# Patient Record
Sex: Male | Born: 1960
Health system: Southern US, Community
[De-identification: ages and names within clinical notes are randomized; demographics above are authoritative.]

## PROBLEM LIST (undated history)

## (undated) DIAGNOSIS — I10 Essential (primary) hypertension: Secondary | ICD-10-CM

## (undated) DIAGNOSIS — K649 Unspecified hemorrhoids: Secondary | ICD-10-CM

## (undated) DIAGNOSIS — R12 Heartburn: Secondary | ICD-10-CM

## (undated) DIAGNOSIS — M1990 Unspecified osteoarthritis, unspecified site: Secondary | ICD-10-CM

## (undated) HISTORY — PX: HERNIA REPAIR: SHX51

---

## 1999-07-03 ENCOUNTER — Encounter: Payer: Self-pay | Admitting: Internal Medicine

## 1999-07-03 ENCOUNTER — Encounter: Admission: RE | Admit: 1999-07-03 | Discharge: 1999-07-03 | Payer: Self-pay | Admitting: Internal Medicine

## 2001-09-15 ENCOUNTER — Emergency Department (HOSPITAL_COMMUNITY): Admission: EM | Admit: 2001-09-15 | Discharge: 2001-09-15 | Payer: Self-pay | Admitting: Emergency Medicine

## 2005-06-23 ENCOUNTER — Emergency Department (HOSPITAL_COMMUNITY): Admission: EM | Admit: 2005-06-23 | Discharge: 2005-06-23 | Payer: Self-pay | Admitting: Family Medicine

## 2012-12-27 ENCOUNTER — Encounter (HOSPITAL_COMMUNITY): Payer: Self-pay | Admitting: Pharmacy Technician

## 2012-12-29 ENCOUNTER — Other Ambulatory Visit (HOSPITAL_COMMUNITY): Payer: Self-pay | Admitting: Orthopedic Surgery

## 2012-12-29 NOTE — Patient Instructions (Addendum)
Mitchell Johnston  12/29/2012   Your procedure is scheduled on: 01/09/13  TUESDAY   Report to [facility] at     12:50 PM Call this number if you have problems the morning of surgery: [phone]       Remember:   Do not eat food  After Midnight. Monday NIGHT--- MAY HAVE CLEAR LIQUIDS Tuesday MORNING UNTIL 0945 AM,  THEN NOTHING BY MOUTH   Take these medicines the morning of surgery with A SIP OF WATER: NONE   .  Contacts, dentures or partial plates can not be worn to surgery  Leave suitcase in the car. After surgery it may be brought to your room.  For patients admitted to the hospital, checkout time is 11:00 AM day of  discharge.             SPECIAL INSTRUCTIONS- SEE [place] PREPARING FOR SURGERY INSTRUCTION SHEET-     DO NOT WEAR JEWELRY, LOTIONS, POWDERS, OR PERFUMES.  WOMEN-- DO NOT SHAVE LEGS OR UNDERARMS FOR 12 HOURS BEFORE SHOWERS. MEN MAY SHAVE FACE.  Patients discharged the day of surgery will not be allowed to drive home. IF going home the day of surgery, you must have a driver and someone to stay with you for the first 24 hours  Name and phone number of your driver:     admission                                                                   Please read over the following fact sheets that you were given: MRSA Information, Incentive Spirometry Sheet, Blood Transfusion Sheet  Information                                                                                 I understand I will have my Type and screen drawn morning of surgery  [name]  PST [phone]  [phone]                 FAILURE TO FOLLOW THESE INSTRUCTIONS MAY RESULT IN  CANCELLATION   OF YOUR SURGERY                                                  Patient Signature _____________________________

## 2013-01-01 ENCOUNTER — Ambulatory Visit (HOSPITAL_COMMUNITY)
Admission: RE | Admit: 2013-01-01 | Discharge: 2013-01-01 | Disposition: A | Discharge status: Home / Self Care | Payer: 59 | Source: Ambulatory Visit | Attending: Orthopedic Surgery | Admitting: Orthopedic Surgery

## 2013-01-01 ENCOUNTER — Encounter (HOSPITAL_COMMUNITY)
Admission: RE | Admit: 2013-01-01 | Discharge: 2013-01-01 | Disposition: A | Discharge status: Home / Self Care | Payer: 59 | Source: Ambulatory Visit | Attending: Orthopedic Surgery | Admitting: Orthopedic Surgery

## 2013-01-01 ENCOUNTER — Encounter (HOSPITAL_COMMUNITY): Payer: Self-pay

## 2013-01-01 DIAGNOSIS — Z01812 Encounter for preprocedural laboratory examination: Secondary | ICD-10-CM | POA: Insufficient documentation

## 2013-01-01 DIAGNOSIS — Z01810 Encounter for preprocedural cardiovascular examination: Secondary | ICD-10-CM | POA: Insufficient documentation

## 2013-01-01 DIAGNOSIS — Z01818 Encounter for other preprocedural examination: Secondary | ICD-10-CM | POA: Insufficient documentation

## 2013-01-01 HISTORY — DX: Unspecified osteoarthritis, unspecified site: M19.90

## 2013-01-01 HISTORY — DX: Heartburn: R12

## 2013-01-01 HISTORY — DX: Unspecified hemorrhoids: K64.9

## 2013-01-01 HISTORY — DX: Essential (primary) hypertension: I10

## 2013-01-01 LAB — BASIC METABOLIC PANEL
Chloride: 104 mEq/L (ref 96–112)
GFR calc Af Amer: >90 mL/min (ref >90)
Potassium: 4.1 mEq/L (ref 3.5–5.1)

## 2013-01-01 LAB — PROTIME-INR: INR: 1.02 (ref 0.00–1.49)

## 2013-01-01 LAB — CBC
HCT: 38.1 % — ABNORMAL LOW (ref 39.0–52.0)
Hemoglobin: 14 g/dL (ref 13.0–17.0)
WBC: 7.9 10*3/uL (ref 4.0–10.5)

## 2013-01-01 LAB — URINALYSIS, ROUTINE W REFLEX MICROSCOPIC
Leukocytes, UA: NEGATIVE
Nitrite: NEGATIVE
Specific Gravity, Urine: 1.025 (ref 1.005–1.030)
pH: 6 (ref 5.0–8.0)

## 2013-01-01 LAB — APTT: aPTT: 32 seconds (ref 24–37)

## 2013-01-01 LAB — SURGICAL PCR SCREEN: Staphylococcus aureus: NEGATIVE

## 2013-01-03 NOTE — H&P (Signed)
TOTAL HIP ADMISSION H&P  Patient is admitted for right total hip arthroplasty, anterior approach.  Subjective:  Chief Complaint:   Right hip OA / pain  HPI: Mitchell Johnston, 52 y.o. male, has a history of pain and functional disability in the right hip(s) due to arthritis and patient has failed non-surgical conservative treatments for greater than 12 weeks to include NSAID's and/or analgesics and activity modification.  Onset of symptoms was gradual starting 5+ years ago with gradually worsening course since that time.The patient noted no past surgery on the right hip(s).  Patient currently rates pain in the right hip at 7 out of 10 with activity. Patient has worsening of pain with activity and weight bearing, trendelenberg gait, pain that interfers with activities of daily living, pain with passive range of motion and crepitus. Patient has evidence of periarticular osteophytes and joint space narrowing by imaging studies. This condition presents safety issues increasing the risk of falls.  There is no current active infection.  Risks, benefits and expectations were discussed with the patient. Patient understand the risks, benefits and expectations and wishes to proceed with surgery.   D/C Plans:   Home with HHPT  Post-op Meds:    No Rx given  Tranexamic Acid:   To be given  Decadron:    To be given  FYI:    ASA post-op   Past Medical History  Diagnosis Date  . Hypertension   . Heartburn   . Arthritis   . Hemorrhoids     Past Surgical History  Procedure Laterality Date  . Hernia repair      inguinal    No prescriptions prior to admission   No Known Allergies   History  Substance Use Topics  . Smoking status: Never Smoker   . Smokeless tobacco: Never Used  . Alcohol Use: No    No family history on file.   Review of Systems  Constitutional: Negative.   HENT: Negative.   Eyes: Negative.   Respiratory: Negative.   Cardiovascular: Negative.   Gastrointestinal: Positive  for heartburn.  Genitourinary: Negative.   Musculoskeletal: Positive for joint pain and myalgias (morning stiffness).  Skin: Negative.   Neurological: Negative.   Endo/Heme/Allergies: Negative.   Psychiatric/Behavioral: Negative.     Objective:  Physical Exam  Constitutional: He appears well-developed and well-nourished.  HENT:  Head: Normocephalic and atraumatic.  Mouth/Throat: Oropharynx is clear and moist.  Eyes: Pupils are equal, round, and reactive to light.  Neck: Neck supple. No JVD present. No tracheal deviation present. No thyromegaly present.  Cardiovascular: Normal rate, regular rhythm, normal heart sounds and intact distal pulses.   Respiratory: Effort normal and breath sounds normal. No stridor. No respiratory distress. He has no wheezes.  GI: Soft. There is no tenderness. There is no guarding.  Musculoskeletal:       Right hip: He exhibits decreased range of motion, decreased strength, tenderness, bony tenderness and crepitus. He exhibits no swelling, no deformity and no laceration.  Lymphadenopathy:    He has no cervical adenopathy.  Neurological: He is alert.  Skin: Skin is warm and dry.  Psychiatric: He has a normal mood and affect.     Imaging Review Plain radiographs demonstrate severe degenerative joint disease of the right hip(s). The bone quality appears to be good for age and reported activity level.  Assessment/Plan:  End stage arthritis, right hip(s)  The patient history, physical examination, clinical judgement of the provider and imaging studies are consistent with end stage degenerative  joint disease of the right hip(s) and total hip arthroplasty is deemed medically necessary. The treatment options including medical management, injection therapy, arthroscopy and arthroplasty were discussed at length. The risks and benefits of total hip arthroplasty were presented and reviewed. The risks due to aseptic loosening, infection, stiffness,  dislocation/subluxation,  thromboembolic complications and other imponderables were discussed.  The patient acknowledged the explanation, agreed to proceed with the plan and consent was signed. Patient is being admitted for inpatient treatment for surgery, pain control, PT, OT, prophylactic antibiotics, VTE prophylaxis, progressive ambulation and ADL's and discharge planning.The patient is planning to be discharged home with home health services.    Anastasio Auerbach Neri Vieyra   PAC  01/03/2013, 3:20 PM

## 2013-01-09 ENCOUNTER — Inpatient Hospital Stay (HOSPITAL_COMMUNITY): Payer: 59

## 2013-01-09 ENCOUNTER — Encounter (HOSPITAL_COMMUNITY): Payer: 59 | Admitting: Anesthesiology

## 2013-01-09 ENCOUNTER — Encounter (HOSPITAL_COMMUNITY): Payer: Self-pay | Admitting: *Deleted

## 2013-01-09 ENCOUNTER — Inpatient Hospital Stay (HOSPITAL_COMMUNITY)
Admission: RE | Admit: 2013-01-09 | Discharge: 2013-01-11 | DRG: 470 | Disposition: A | Discharge status: Home Health | Payer: 59 | Source: Ambulatory Visit | Attending: Orthopedic Surgery | Admitting: Orthopedic Surgery

## 2013-01-09 ENCOUNTER — Inpatient Hospital Stay (HOSPITAL_COMMUNITY): Payer: 59 | Admitting: Anesthesiology

## 2013-01-09 ENCOUNTER — Encounter (HOSPITAL_COMMUNITY)
Admission: RE | Disposition: A | Discharge status: Home Health | Payer: Self-pay | Source: Ambulatory Visit | Attending: Orthopedic Surgery

## 2013-01-09 DIAGNOSIS — D500 Iron deficiency anemia secondary to blood loss (chronic): Secondary | ICD-10-CM | POA: Diagnosis not present

## 2013-01-09 DIAGNOSIS — M1610 Unilateral primary osteoarthritis, unspecified hip: Principal | ICD-10-CM | POA: Diagnosis present

## 2013-01-09 DIAGNOSIS — M169 Osteoarthritis of hip, unspecified: Principal | ICD-10-CM | POA: Diagnosis present

## 2013-01-09 DIAGNOSIS — Z96649 Presence of unspecified artificial hip joint: Secondary | ICD-10-CM

## 2013-01-09 DIAGNOSIS — D62 Acute posthemorrhagic anemia: Secondary | ICD-10-CM | POA: Diagnosis not present

## 2013-01-09 DIAGNOSIS — I10 Essential (primary) hypertension: Secondary | ICD-10-CM | POA: Diagnosis present

## 2013-01-09 HISTORY — PX: TOTAL HIP ARTHROPLASTY: SHX124

## 2013-01-09 LAB — TYPE AND SCREEN: ABO/RH(D): O POS

## 2013-01-09 SURGERY — ARTHROPLASTY, HIP, TOTAL, ANTERIOR APPROACH
Anesthesia: Spinal | Site: Hip | Laterality: Right | Wound class: Clean

## 2013-01-09 MED ORDER — BISACODYL 10 MG RE SUPP: 10.0000 mg | Freq: Every day | RECTAL | Status: DC | PRN

## 2013-01-09 MED ORDER — METOCLOPRAMIDE HCL 10 MG PO TABS: 5.0000 mg | ORAL_TABLET | Freq: Three times a day (TID) | ORAL | Status: DC | PRN

## 2013-01-09 MED ORDER — PROPOFOL INFUSION 10 MG/ML OPTIME
INTRAVENOUS | Status: DC | PRN
  Administered 2013-01-09: 100 ug/kg/min via INTRAVENOUS

## 2013-01-09 MED ORDER — CELECOXIB 200 MG PO CAPS
200.0000 mg | ORAL_CAPSULE | Freq: Two times a day (BID) | ORAL | Status: DC
  Administered 2013-01-09 – 2013-01-11 (×4): 200 mg via ORAL
  Filled 2013-01-09 (×5): qty 1

## 2013-01-09 MED ORDER — LACTATED RINGERS IV SOLN: INTRAVENOUS | Status: DC

## 2013-01-09 MED ORDER — DEXAMETHASONE SODIUM PHOSPHATE 10 MG/ML IJ SOLN
10.0000 mg | Freq: Once | INTRAMUSCULAR | Status: AC
  Administered 2013-01-10: 10 mg via INTRAVENOUS
  Filled 2013-01-09: qty 1

## 2013-01-09 MED ORDER — ZOLPIDEM TARTRATE 5 MG PO TABS: 5.0000 mg | ORAL_TABLET | Freq: Every evening | ORAL | Status: DC | PRN

## 2013-01-09 MED ORDER — HYDROCODONE-ACETAMINOPHEN 7.5-325 MG PO TABS
1.0000 | ORAL_TABLET | ORAL | Status: DC
  Administered 2013-01-09 – 2013-01-10 (×3): 2 via ORAL
  Administered 2013-01-10 (×2): 1 via ORAL
  Administered 2013-01-10 – 2013-01-11 (×2): 2 via ORAL
  Filled 2013-01-09: qty 1
  Filled 2013-01-09 (×7): qty 2

## 2013-01-09 MED ORDER — SODIUM CHLORIDE 0.9 % IV SOLN
100.0000 mL/h | INTRAVENOUS | Status: DC
  Administered 2013-01-09 – 2013-01-10 (×2): 100 mL/h via INTRAVENOUS
  Filled 2013-01-09 (×10): qty 1000

## 2013-01-09 MED ORDER — TRANEXAMIC ACID 100 MG/ML IV SOLN
1000.0000 mg | Freq: Once | INTRAVENOUS | Status: AC
  Administered 2013-01-09: 1000 mg via INTRAVENOUS
  Filled 2013-01-09: qty 10

## 2013-01-09 MED ORDER — ONDANSETRON HCL 4 MG/2ML IJ SOLN
4.0000 mg | Freq: Four times a day (QID) | INTRAMUSCULAR | Status: DC | PRN
  Administered 2013-01-10: 4 mg via INTRAVENOUS
  Filled 2013-01-09: qty 2

## 2013-01-09 MED ORDER — TRIAMTERENE-HCTZ 37.5-25 MG PO TABS
1.0000 | ORAL_TABLET | Freq: Every morning | ORAL | Status: DC
  Administered 2013-01-10 – 2013-01-11 (×2): 1 via ORAL
  Filled 2013-01-09 (×2): qty 1

## 2013-01-09 MED ORDER — MIDAZOLAM HCL 5 MG/5ML IJ SOLN
INTRAMUSCULAR | Status: DC | PRN
  Administered 2013-01-09: 2 mg via INTRAVENOUS

## 2013-01-09 MED ORDER — FLEET ENEMA 7-19 GM/118ML RE ENEM: 1.0000 | ENEMA | Freq: Once | RECTAL | Status: AC | PRN

## 2013-01-09 MED ORDER — PHENOL 1.4 % MT LIQD: 1.0000 | OROMUCOSAL | Status: DC | PRN

## 2013-01-09 MED ORDER — DIPHENHYDRAMINE HCL 25 MG PO CAPS: 25.0000 mg | ORAL_CAPSULE | Freq: Four times a day (QID) | ORAL | Status: DC | PRN

## 2013-01-09 MED ORDER — METOCLOPRAMIDE HCL 5 MG/ML IJ SOLN
5.0000 mg | Freq: Three times a day (TID) | INTRAMUSCULAR | Status: DC | PRN
  Administered 2013-01-10: 10 mg via INTRAVENOUS
  Filled 2013-01-09: qty 2

## 2013-01-09 MED ORDER — METHOCARBAMOL 500 MG PO TABS
500.0000 mg | ORAL_TABLET | Freq: Four times a day (QID) | ORAL | Status: DC | PRN
  Administered 2013-01-10: 500 mg via ORAL
  Filled 2013-01-09 (×2): qty 1

## 2013-01-09 MED ORDER — FERROUS SULFATE 325 (65 FE) MG PO TABS
325.0000 mg | ORAL_TABLET | Freq: Three times a day (TID) | ORAL | Status: DC
  Administered 2013-01-10 – 2013-01-11 (×5): 325 mg via ORAL
  Filled 2013-01-09 (×7): qty 1

## 2013-01-09 MED ORDER — HYDROMORPHONE HCL PF 1 MG/ML IJ SOLN: 0.2500 mg | INTRAMUSCULAR | Status: DC | PRN

## 2013-01-09 MED ORDER — MENTHOL 3 MG MT LOZG: 1.0000 | LOZENGE | OROMUCOSAL | Status: DC | PRN

## 2013-01-09 MED ORDER — CEFAZOLIN SODIUM-DEXTROSE 2-3 GM-% IV SOLR
INTRAVENOUS | Status: AC
  Filled 2013-01-09: qty 50

## 2013-01-09 MED ORDER — HYDROMORPHONE HCL PF 1 MG/ML IJ SOLN
0.5000 mg | INTRAMUSCULAR | Status: DC | PRN
  Administered 2013-01-09: 1 mg via INTRAVENOUS
  Filled 2013-01-09: qty 1

## 2013-01-09 MED ORDER — ALUM & MAG HYDROXIDE-SIMETH 200-200-20 MG/5ML PO SUSP: 30.0000 mL | ORAL | Status: DC | PRN

## 2013-01-09 MED ORDER — ASPIRIN EC 325 MG PO TBEC
325.0000 mg | DELAYED_RELEASE_TABLET | Freq: Two times a day (BID) | ORAL | Status: DC
  Administered 2013-01-10 – 2013-01-11 (×3): 325 mg via ORAL
  Filled 2013-01-09 (×5): qty 1

## 2013-01-09 MED ORDER — LACTATED RINGERS IV SOLN
INTRAVENOUS | Status: DC | PRN
  Administered 2013-01-09 (×2): via INTRAVENOUS

## 2013-01-09 MED ORDER — POLYETHYLENE GLYCOL 3350 17 G PO PACK
17.0000 g | PACK | Freq: Two times a day (BID) | ORAL | Status: DC
  Administered 2013-01-10 – 2013-01-11 (×2): 17 g via ORAL

## 2013-01-09 MED ORDER — BUPIVACAINE HCL (PF) 0.5 % IJ SOLN
INTRAMUSCULAR | Status: AC
  Filled 2013-01-09: qty 30

## 2013-01-09 MED ORDER — CEFAZOLIN SODIUM-DEXTROSE 2-3 GM-% IV SOLR
2.0000 g | INTRAVENOUS | Status: AC
  Administered 2013-01-09: 2 g via INTRAVENOUS

## 2013-01-09 MED ORDER — CEFAZOLIN SODIUM-DEXTROSE 2-3 GM-% IV SOLR
2.0000 g | Freq: Four times a day (QID) | INTRAVENOUS | Status: AC
  Administered 2013-01-09 – 2013-01-10 (×2): 2 g via INTRAVENOUS
  Filled 2013-01-09 (×2): qty 50

## 2013-01-09 MED ORDER — DEXTROSE 5 % IV SOLN
500.0000 mg | Freq: Four times a day (QID) | INTRAVENOUS | Status: DC | PRN
  Filled 2013-01-09: qty 5

## 2013-01-09 MED ORDER — ONDANSETRON HCL 4 MG PO TABS: 4.0000 mg | ORAL_TABLET | Freq: Four times a day (QID) | ORAL | Status: DC | PRN

## 2013-01-09 MED ORDER — DOCUSATE SODIUM 100 MG PO CAPS
100.0000 mg | ORAL_CAPSULE | Freq: Two times a day (BID) | ORAL | Status: DC
  Administered 2013-01-09 – 2013-01-11 (×3): 100 mg via ORAL

## 2013-01-09 MED ORDER — DEXAMETHASONE SODIUM PHOSPHATE 10 MG/ML IJ SOLN: 10.0000 mg | Freq: Once | INTRAMUSCULAR | Status: DC

## 2013-01-09 MED ORDER — FENTANYL CITRATE 0.05 MG/ML IJ SOLN
INTRAMUSCULAR | Status: DC | PRN
  Administered 2013-01-09: 50 ug via INTRAVENOUS

## 2013-01-09 SURGICAL SUPPLY — 40 items
ADH SKN CLS APL DERMABOND .7 (GAUZE/BANDAGES/DRESSINGS) ×1
BAG SPEC THK2 15X12 ZIP CLS (MISCELLANEOUS) ×2
BAG ZIPLOCK 12X15 (MISCELLANEOUS) ×4 IMPLANT
BLADE SAW SGTL 18X1.27X75 (BLADE) ×2 IMPLANT
CAPT HIP PF COP ×1 IMPLANT
CLOTH BEACON ORANGE TIMEOUT ST (SAFETY) ×2 IMPLANT
DERMABOND ADVANCED (GAUZE/BANDAGES/DRESSINGS) ×1
DERMABOND ADVANCED .7 DNX12 (GAUZE/BANDAGES/DRESSINGS) ×1 IMPLANT
DRAPE C-ARM 42X120 X-RAY (DRAPES) ×2 IMPLANT
DRAPE STERI IOBAN 125X83 (DRAPES) ×2 IMPLANT
DRAPE U-SHAPE 47X51 STRL (DRAPES) ×6 IMPLANT
DRSG AQUACEL AG ADV 3.5X10 (GAUZE/BANDAGES/DRESSINGS) ×2 IMPLANT
DRSG TEGADERM 4X4.75 (GAUZE/BANDAGES/DRESSINGS) IMPLANT
DURAPREP 26ML APPLICATOR (WOUND CARE) ×2 IMPLANT
ELECT BLADE TIP CTD 4 INCH (ELECTRODE) ×2 IMPLANT
ELECT REM PT RETURN 9FT ADLT (ELECTROSURGICAL) ×2
ELECTRODE REM PT RTRN 9FT ADLT (ELECTROSURGICAL) ×1 IMPLANT
EVACUATOR 1/8 PVC DRAIN (DRAIN) IMPLANT
FACESHIELD LNG OPTICON STERILE (SAFETY) ×8 IMPLANT
GAUZE SPONGE 2X2 8PLY STRL LF (GAUZE/BANDAGES/DRESSINGS) ×1 IMPLANT
GLOVE BIOGEL PI IND STRL 7.5 (GLOVE) ×1 IMPLANT
GLOVE BIOGEL PI IND STRL 8 (GLOVE) ×1 IMPLANT
GLOVE BIOGEL PI INDICATOR 7.5 (GLOVE) ×1
GLOVE BIOGEL PI INDICATOR 8 (GLOVE) ×1
GLOVE ECLIPSE 8.0 STRL XLNG CF (GLOVE) ×2 IMPLANT
GLOVE ORTHO TXT STRL SZ7.5 (GLOVE) ×4 IMPLANT
GOWN BRE IMP PREV XXLGXLNG (GOWN DISPOSABLE) ×2 IMPLANT
GOWN PREVENTION PLUS LG XLONG (DISPOSABLE) ×2 IMPLANT
KIT BASIN OR (CUSTOM PROCEDURE TRAY) ×2 IMPLANT
PACK TOTAL JOINT (CUSTOM PROCEDURE TRAY) ×2 IMPLANT
PADDING CAST COTTON 6X4 STRL (CAST SUPPLIES) ×2 IMPLANT
SPONGE GAUZE 2X2 STER 10/PKG (GAUZE/BANDAGES/DRESSINGS) ×1
SUCTION FRAZIER 12FR DISP (SUCTIONS) ×2 IMPLANT
SUT MNCRL AB 4-0 PS2 18 (SUTURE) ×2 IMPLANT
SUT VIC AB 1 CT1 36 (SUTURE) ×8 IMPLANT
SUT VIC AB 2-0 CT1 27 (SUTURE) ×4
SUT VIC AB 2-0 CT1 TAPERPNT 27 (SUTURE) ×2 IMPLANT
SUT VLOC 180 0 24IN GS25 (SUTURE) ×2 IMPLANT
TOWEL OR 17X26 10 PK STRL BLUE (TOWEL DISPOSABLE) ×4 IMPLANT
TRAY FOLEY CATH 14FRSI W/METER (CATHETERS) ×2 IMPLANT

## 2013-01-09 NOTE — Anesthesia Postprocedure Evaluation (Signed)
  Anesthesia Post-op Note  Patient: Mitchell Johnston  Procedure(s) Performed: Procedure(s) (LRB): RIGHT TOTAL HIP ARTHROPLASTY ANTERIOR APPROACH (Right)  Patient Location: PACU  Anesthesia Type: Spinal  Level of Consciousness: awake and alert   Airway and Oxygen Therapy: Patient Spontanous Breathing  Post-op Pain: mild  Post-op Assessment: Post-op Vital signs reviewed, Patient's Cardiovascular Status Stable, Respiratory Function Stable, Patent Airway and No signs of Nausea or vomiting  Last Vitals:  Filed Vitals:   01/09/13 1915  BP:   Pulse:   Temp: 36.6 C  Resp:     Post-op Vital Signs: stable   Complications: No apparent anesthesia complications

## 2013-01-09 NOTE — Interval H&P Note (Signed)
History and Physical Interval Note:  01/09/2013 4:08 PM  Mitchell Johnston  has presented today for surgery, with the diagnosis of osteoarthritis of the Right Knee  The various methods of treatment have been discussed with the patient and family. After consideration of risks, benefits and other options for treatment, the patient has consented to  Procedure(s): RIGHT TOTAL HIP ARTHROPLASTY ANTERIOR APPROACH (Right) as a surgical intervention .  The patient's history has been reviewed, patient examined, no change in status, stable for surgery.  I have reviewed the patient's chart and labs.  Questions were answered to the patient's satisfaction.     Shelda Pal

## 2013-01-09 NOTE — Anesthesia Preprocedure Evaluation (Addendum)
Anesthesia Evaluation  Patient identified by MRN, date of birth, ID band Patient awake    Reviewed: Allergy & Precautions, H&P , NPO status , Patient's Chart, lab work & pertinent test results  Airway Mallampati: II TM Distance: >3 FB Neck ROM: full    Dental no notable dental hx. (+) Teeth Intact and Dental Advisory Given   Pulmonary neg pulmonary ROS,  breath sounds clear to auscultation  Pulmonary exam normal       Cardiovascular Exercise Tolerance: Good hypertension, Pt. on medications Rhythm:regular Rate:Normal     Neuro/Psych negative neurological ROS  negative psych ROS   GI/Hepatic negative GI ROS, Neg liver ROS,   Endo/Other  negative endocrine ROS  Renal/GU negative Renal ROS  negative genitourinary   Musculoskeletal   Abdominal   Peds  Hematology negative hematology ROS (+)   Anesthesia Other Findings   Reproductive/Obstetrics negative OB ROS                          Anesthesia Physical Anesthesia Plan  ASA: II  Anesthesia Plan: Spinal   Post-op Pain Management:    Induction:   Airway Management Planned: Simple Face Mask  Additional Equipment:   Intra-op Plan:   Post-operative Plan:   Informed Consent: I have reviewed the patients History and Physical, chart, labs and discussed the procedure including the risks, benefits and alternatives for the proposed anesthesia with the patient or authorized representative who has indicated his/her understanding and acceptance.   Dental Advisory Given  Plan Discussed with: CRNA and Surgeon  Anesthesia Plan Comments:        Anesthesia Quick Evaluation

## 2013-01-09 NOTE — Anesthesia Procedure Notes (Addendum)
Spinal  Patient location during procedure: OR Start time: 01/09/2013 4:57 PM End time: 01/09/2013 5:00 PM Staffing CRNA/Resident: Paris Lore Performed by: resident/CRNA  Preanesthetic Checklist Completed: patient identified, site marked, surgical consent, pre-op evaluation, timeout performed, IV checked, risks and benefits discussed and monitors and equipment checked Spinal Block Patient position: sitting Prep: Betadine Patient monitoring: heart rate, continuous pulse ox and blood pressure Approach: right paramedian Location: L2-3 Injection technique: single-shot Needle Needle type: Whitacre  Needle gauge: 25 G Needle length: 9 cm Needle insertion depth: 6 cm Assessment Sensory level: T4 Additional Notes Expiration date of kit checked and confirmed. Patient tolerated procedure well, without complications.

## 2013-01-09 NOTE — Op Note (Signed)
NAME:  Mitchell Johnston                ACCOUNT NO.: [id number]      MEDICAL RECORD NO.: [id number]      FACILITY:  [facility]      PHYSICIAN:  Durene Romans D  DATE OF BIRTH:  21-Feb-1961     DATE OF PROCEDURE:  01/09/2013                                 OPERATIVE REPORT         PREOPERATIVE DIAGNOSIS: Right  hip osteoarthritis.      POSTOPERATIVE DIAGNOSIS:  Right hip osteoarthritis.      PROCEDURE:  Right total hip replacement through an anterior approach   utilizing DePuy THR system, component size 52mm pinnacle cup, a size 36+4 neutral   Altrex liner, a size 3 Hi Tri Lock stem with a 36+5 delta ceramic   ball.      SURGEON:  Madlyn Frankel. Charlann Boxer, M.D.      ASSISTANT:  Lanney Gins, PA-C      ANESTHESIA:  Spinal.      SPECIMENS:  None.      COMPLICATIONS:  None.      BLOOD LOSS:  100 cc     DRAINS:  none.      INDICATION OF THE PROCEDURE:  Mitchell Johnston is a 52 y.o. male who had   presented to office for evaluation of right hip pain.  Radiographs revealed   progressive degenerative changes with bone-on-bone   articulation to the  hip joint, significant peri-articular osteophytes associated with a very limited range of motion.  The patient had painful limited range of   motion significantly affecting their overall quality of life.  The patient was failing to    respond to conservative measures, and at this point was ready   to proceed with more definitive measures.  The patient has noted progressive   degenerative changes in his hip, progressive problems and dysfunction   with regarding the hip prior to surgery.  Consent was obtained for   benefit of pain relief.  Specific risk of infection, DVT, component   failure, dislocation, need for revision surgery, as well discussion of   the anterior versus posterior approach were reviewed.  Consent was   obtained for benefit of anterior pain relief through an anterior   approach.      PROCEDURE IN  DETAIL:  The patient was brought to operative theater.   Once adequate anesthesia, preoperative antibiotics, 2gm Ancef administered.   The patient was positioned supine on the OSI Hanna table.  Once adequate   padding of boney process was carried out, we had predraped out the hip, and  used fluoroscopy to confirm orientation of the pelvis and position.      The right hip was then prepped and draped from proximal iliac crest to   mid thigh with shower curtain technique.      Time-out was performed identifying the patient, planned procedure, and   extremity.     An incision was then made 2 cm distal and lateral to the   anterior superior iliac spine extending over the orientation of the   tensor fascia lata muscle and sharp dissection was carried down to the   fascia of the muscle and protractor placed in the soft tissues.      The fascia  was then incised.  The muscle belly was identified and swept   laterally and retractor placed along the superior neck.  Following   cauterization of the circumflex vessels and removing some pericapsular   fat, a second cobra retractor was placed on the inferior neck.  A third   retractor was placed on the anterior acetabulum after elevating the   anterior rectus.  A L-capsulotomy was along the line of the   superior neck to the trochanteric fossa, then extended proximally and   distally.  Tag sutures were placed and the retractors were then placed   intracapsular.  We then identified the trochanteric fossa and   orientation of my neck cut, confirmed this radiographically   and then made a neck osteotomy with the femur on traction.  The femoral   head was removed without difficulty or complication.  Traction was let   off and retractors were placed posterior and anterior around the   acetabulum.      The labrum and foveal tissue were debrided.  I began reaming with a 47mm   reamer and reamed up to 51mm reamer with good bony bed preparation and a 52    cup was chosen.  The final 52mm Pinnacle cup was then impacted under fluoroscopy  to confirm the depth of penetration and orientation with respect to   abduction.  A screw was placed followed by the hole eliminator.  The final   36+4 neutral Altrex liner was impacted with good visualized rim fit.  The cup was positioned anatomically within the acetabular portion of the pelvis.      At this point, the femur was rolled at 80 degrees.  Further capsule was   released off the inferior aspect of the femoral neck.  I then   released the superior capsule proximally.  The hook was placed laterally   along the femur and elevated manually and held in position with the bed   hook.  The leg was then extended and adducted with the leg rolled to 100   degrees of external rotation.  Once the proximal femur was fully   exposed, I used a box osteotome to set orientation.  I then began   broaching with the starting chili pepper broach and passed this by hand and then broached up to 3.  With the 3 broach in place I chose a high offset neck and did a trial reductions.  The offset was appropriate, leg lengths   appeared to be equal best with a +5 ball, confirmed radiographically.   Given these findings, I went ahead and dislocated the hip, repositioned all   retractors and positioned the right hip in the extended and abducted position.  The final 3 Hi Tri Lock stem was   chosen and it was impacted down to the level of neck cut.  Based on this   and the trial reduction, a 36+5 delta ceramic ball was chosen and   impacted onto a clean and dry trunnion, and the hip was reduced.  The   hip had been irrigated throughout the case again at this point.  I did   reapproximate the superior capsular leaflet to the anterior leaflet   using #1 Vicryl, placed a medium Hemovac drain deep.  The fascia of the   tensor fascia lata muscle was then reapproximated using #1 Vicryl.  The   remaining wound was closed with 2-0 Vicryl and  running 4-0 Monocryl.   The hip was cleaned, dried,  and dressed sterilely using Dermabond and   Aquacel dressing.  Drain site dressed separately.  She was then brought   to recovery room in stable condition tolerating the procedure well.    Lanney Gins, PA-C was present for the entirety of the case involved from   preoperative positioning, perioperative retractor management, general   facilitation of the case, as well as primary wound closure as assistant.            Madlyn Frankel Charlann Boxer, M.D.            MDO/MEDQ  D:  01/19/2011  T:  01/19/2011  Job:  [id number]      Electronically Signed by Durene Romans M.D. on 01/25/2011 09:15:38 AM

## 2013-01-09 NOTE — Transfer of Care (Signed)
Immediate Anesthesia Transfer of Care Note  Patient: Mitchell Johnston  Procedure(s) Performed: Procedure(s) (LRB): RIGHT TOTAL HIP ARTHROPLASTY ANTERIOR APPROACH (Right)  Patient Location: PACU  Anesthesia Type: Spinal  Level of Consciousness: sedated, patient cooperative and responds to stimulation  Airway & Oxygen Therapy: Patient Spontanous Breathing and Patient connected to face mask oxgen  Post-op Assessment: Report given to PACU RN and Post -op Vital signs reviewed and stable  Post vital signs: Reviewed and stable  Complications: No apparent anesthesia complications

## 2013-01-10 DIAGNOSIS — D500 Iron deficiency anemia secondary to blood loss (chronic): Secondary | ICD-10-CM | POA: Diagnosis not present

## 2013-01-10 LAB — BASIC METABOLIC PANEL
CO2: 25 mEq/L (ref 19–32)
Chloride: 105 mEq/L (ref 96–112)
Creatinine, Ser: 0.89 mg/dL (ref 0.50–1.35)
Glucose, Bld: 115 mg/dL — ABNORMAL HIGH (ref 70–99)
Sodium: 135 mEq/L (ref 135–145)

## 2013-01-10 LAB — CBC
Hemoglobin: 11.4 g/dL — ABNORMAL LOW (ref 13.0–17.0)
MCV: 81.3 fL (ref 78.0–100.0)
Platelets: 219 10*3/uL (ref 150–400)
RBC: 3.91 MIL/uL — ABNORMAL LOW (ref 4.22–5.81)
WBC: 18.1 10*3/uL — ABNORMAL HIGH (ref 4.0–10.5)

## 2013-01-10 MED ORDER — POLYETHYLENE GLYCOL 3350 17 G PO PACK: 17.0000 g | PACK | Freq: Two times a day (BID) | ORAL | Status: DC

## 2013-01-10 MED ORDER — METHOCARBAMOL 500 MG PO TABS: 500.0000 mg | ORAL_TABLET | Freq: Four times a day (QID) | ORAL | Status: DC | PRN

## 2013-01-10 MED ORDER — FERROUS SULFATE 325 (65 FE) MG PO TABS: 325.0000 mg | ORAL_TABLET | Freq: Three times a day (TID) | ORAL | Status: DC

## 2013-01-10 MED ORDER — DSS 100 MG PO CAPS: 100.0000 mg | ORAL_CAPSULE | Freq: Two times a day (BID) | ORAL | Status: DC

## 2013-01-10 MED ORDER — ASPIRIN 325 MG PO TBEC: 325.0000 mg | DELAYED_RELEASE_TABLET | Freq: Two times a day (BID) | ORAL | Status: AC

## 2013-01-10 MED ORDER — HYDROCODONE-ACETAMINOPHEN 7.5-325 MG PO TABS: 1.0000 | ORAL_TABLET | ORAL | Status: DC | PRN

## 2013-01-10 NOTE — Progress Notes (Signed)
[facility] is providing the following services: RW and Commode  If patient discharges after hours, please call [phone].   Renard Hamper 01/10/2013, 8:41 AM

## 2013-01-10 NOTE — Progress Notes (Signed)
Utilization review completed.  

## 2013-01-10 NOTE — Progress Notes (Signed)
Physical Therapy Treatment Patient Details Name: Mitchell Johnston MRN: [id number] DOB: 1961-03-26 Today's Date: 01/10/2013 Time: 4098-1191 PT Time Calculation (min): 28 min  PT Assessment / Plan / Recommendation  History of Present Illness pt was admitted for R DA THA   PT Comments   Reviewed car transfers and stairs.  Follow Up Recommendations  Home health PT     Does the patient have the potential to tolerate intense rehabilitation     Barriers to Discharge        Equipment Recommendations  Rolling walker with 5" wheels    Recommendations for Other Services OT consult  Frequency 7X/week   Progress towards PT Goals Progress towards PT goals: Progressing toward goals  Plan Current plan remains appropriate    Precautions / Restrictions Precautions Precautions: Fall Restrictions Weight Bearing Restrictions: No   Pertinent Vitals/Pain 4-5/10; pain meds requested, ice pack provided    Mobility  Bed Mobility Bed Mobility: Sit to Supine Sit to Supine: 4: Min assist Details for Bed Mobility Assistance: cues for sequence and use of L LE to self assist Transfers Transfers: Sit to Stand;Stand to Sit Sit to Stand: 4: Min guard Stand to Sit: 4: Min guard Details for Transfer Assistance: cues for hand and leg placement Ambulation/Gait Ambulation/Gait Assistance: 4: Min guard;5: Supervision Ambulation Distance (Feet): 450 Feet Assistive device: Rolling walker Ambulation/Gait Assistance Details: cues for posture and position from RW Gait Pattern: Step-to pattern;Step-through pattern;Decreased step length - right;Decreased step length - left;Shuffle;Trunk flexed Stairs: Yes Stairs Assistance: 4: Min assist Stairs Assistance Details (indicate cue type and reason): cues for sequence and foot placement Stair Management Technique: One rail Right;Forwards;Step to pattern Number of Stairs: 4    Exercises Total Joint Exercises Ankle Circles/Pumps: AROM;15 reps;Supine;Both Quad  Sets: AROM;Both;10 reps;Supine Gluteal Sets: AROM;Both;10 reps;Supine Heel Slides: AAROM;15 reps;Supine;Right Hip ABduction/ADduction: AAROM;Right;15 reps;Supine   PT Diagnosis:    PT Problem List:   PT Treatment Interventions:     PT Goals (current goals can now be found in the care plan section) Acute Rehab PT Goals Patient Stated Goal: Resume previous lifestyle with decreased pain PT Goal Formulation: With patient Time For Goal Achievement: 01/13/13 Potential to Achieve Goals: Good  Visit Information  Last PT Received On: 01/10/13 Assistance Needed: +1 History of Present Illness: pt was admitted for R DA THA    Subjective Data  Patient Stated Goal: Resume previous lifestyle with decreased pain   Cognition  Cognition Arousal/Alertness: Awake/alert Behavior During Therapy: WFL for tasks assessed/performed Overall Cognitive Status: Within Functional Limits for tasks assessed    Balance     End of Session PT - End of Session Equipment Utilized During Treatment: Gait belt Activity Tolerance: Patient tolerated treatment well Patient left: in bed;with call bell/phone within reach;with family/visitor present Nurse Communication: Mobility status   GP     Mitchell Johnston 01/10/2013, 3:39 PM

## 2013-01-10 NOTE — Progress Notes (Signed)
Occupational Therapy Evaluation Patient Details Name: Mitchell Johnston MRN: [id number] DOB: [birth year] Today's Date: 01/10/2013 Time: 4098-1191 OT Time Calculation (min): 21 min  OT Assessment / Plan / Recommendation History of present illness pt was admitted for R DA THA   Clinical Impression   Pt was admitted for the above.  All education was completed; pt does not need any further OT at this time.     OT Assessment  Patient does not need any further OT services    Follow Up Recommendations  No OT follow up    Barriers to Discharge      Equipment Recommendations  3 in 1 bedside comode    Recommendations for Other Services    Frequency       Precautions / Restrictions Restrictions Weight Bearing Restrictions: No   Pertinent Vitals/Pain Tightness in RLE; no pain.  Repositioned with ice.  Pt vomited during session; then felt better.  RN aware.     ADL  Grooming: Teeth care;Supervision/safety Where Assessed - Grooming: Supported standing Lower Body Bathing: Minimal assistance Where Assessed - Lower Body Bathing: Supported sit to stand Lower Body Dressing: Moderate assistance Where Assessed - Lower Body Dressing: Supported sit to stand Toilet Transfer: Hydrographic surveyor Method: Sit to Barista: Bedside commode;Grab bars Equipment Used: Rolling walker Transfers/Ambulation Related to ADLs: ambulated to bathroom; pt feels tightness in RLE and would benefit from 3:1 commode as his commode is lower than one we practiced on. ADL Comments: educated on reacher for adls.  Pt plans to get this.   Educated on tub transfer/readiness.  Pt verbalizes   OT Diagnosis:    OT Problem List:   OT Treatment Interventions:     OT Goals(Current goals can be found in the care plan section)    Visit Information  Last OT Received On: 01/10/13 Assistance Needed: +1 History of Present Illness: pt was admitted for R DA THA       Prior Functioning     Home Living Family/patient expects to be discharged to:: Private residence Living Arrangements: Alone Additional Comments: mother will stay through weekend and pt will have intermittent assistance after that.   Prior Function Level of Independence: Independent Communication Communication: No difficulties         Vision/Perception     Cognition  Cognition Behavior During Therapy: WFL for tasks assessed/performed Overall Cognitive Status: Within Functional Limits for tasks assessed    Extremity/Trunk Assessment Upper Extremity Assessment Upper Extremity Assessment: Overall WFL for tasks assessed     Mobility Transfers Transfers: Sit to Stand;Stand to Sit Sit to Stand: 4: Min guard;From chair/3-in-1;From toilet;With upper extremity assist Stand to Sit: 4: Min guard Details for Transfer Assistance: cues for hand and leg placement     Exercise     Balance     End of Session OT - End of Session Activity Tolerance: Patient tolerated treatment well Patient left: in chair;with call bell/phone within reach;with family/visitor present  GO     Mitchell Johnston 01/10/2013, 10:18 AM Mitchell Johnston, OTR/L [phone] 01/10/2013

## 2013-01-10 NOTE — Evaluation (Signed)
Physical Therapy Evaluation Patient Details Name: Mitchell Johnston MRN: [id number] DOB: 1960-05-17 Today's Date: 01/10/2013 Time: 4098-1191 PT Time Calculation (min): 27 min  PT Assessment / Plan / Recommendation History of Present Illness  pt was admitted for R DA THA  Clinical Impression  Pt s/p R THR presents with decreased R LE strength/ROM and post op pain limiting functional mobility.  Pt should progress well to d/c home with assist of mother and HHPT follow up.    PT Assessment  Patient needs continued PT services    Follow Up Recommendations  Home health PT    Does the patient have the potential to tolerate intense rehabilitation      Barriers to Discharge        Equipment Recommendations  Rolling walker with 5" wheels    Recommendations for Other Services OT consult   Frequency 7X/week    Precautions / Restrictions Precautions Precautions: Fall Restrictions Weight Bearing Restrictions: No   Pertinent Vitals/Pain 3/10; premed, ice pack provided      Mobility  Bed Mobility Bed Mobility: Supine to Sit Supine to Sit: 4: Min assist Details for Bed Mobility Assistance: cues for sequence and use of L LE to self assist Transfers Transfers: Sit to Stand;Stand to Sit Sit to Stand: 4: Min guard;From chair/3-in-1;From toilet;With upper extremity assist Stand to Sit: 4: Min guard Details for Transfer Assistance: cues for hand and leg placement Ambulation/Gait Ambulation/Gait Assistance: 4: Min assist Ambulation Distance (Feet): 200 Feet Assistive device: Rolling walker Ambulation/Gait Assistance Details: cues for initial sequence, posture and position from RW Gait Pattern: Step-to pattern;Step-through pattern;Decreased step length - right;Decreased step length - left;Shuffle;Trunk flexed    Exercises Total Joint Exercises Ankle Circles/Pumps: AROM;15 reps;Supine;Both Quad Sets: AROM;Both;10 reps;Supine Heel Slides: AAROM;15 reps;Supine;Right Hip  ABduction/ADduction: AAROM;Right;15 reps;Supine   PT Diagnosis: Difficulty walking  PT Problem List: Decreased strength;Decreased range of motion;Decreased activity tolerance;Decreased mobility;Decreased knowledge of use of DME;Pain PT Treatment Interventions: DME instruction;Gait training;Stair training;Functional mobility training;Therapeutic activities;Therapeutic exercise;Patient/family education     PT Goals(Current goals can be found in the care plan section) Acute Rehab PT Goals Patient Stated Goal: Resume previous lifestyle with decreased pain PT Goal Formulation: With patient Time For Goal Achievement: 01/13/13 Potential to Achieve Goals: Good  Visit Information  Last PT Received On: 01/10/13 Assistance Needed: +1 History of Present Illness: pt was admitted for R DA THA       Prior Functioning  Home Living Family/patient expects to be discharged to:: Private residence Living Arrangements: Alone Available Help at Discharge: Family Type of Home: Apartment Home Access: Level entry Home Layout: One level Home Equipment: None Additional Comments: mother will stay through weekend and pt will have intermittent assistance after that.   Prior Function Level of Independence: Independent Communication Communication: No difficulties    Cognition  Cognition Arousal/Alertness: Awake/alert Behavior During Therapy: WFL for tasks assessed/performed Overall Cognitive Status: Within Functional Limits for tasks assessed    Extremity/Trunk Assessment Upper Extremity Assessment Upper Extremity Assessment: Overall WFL for tasks assessed Lower Extremity Assessment Lower Extremity Assessment: RLE deficits/detail RLE Deficits / Details: Hip strength 2+/5 with AAROM at hip to 90 flex and 15 abd   Balance    End of Session PT - End of Session Equipment Utilized During Treatment: Gait belt Activity Tolerance: Patient tolerated treatment well Patient left: in chair;with call bell/phone  within reach Nurse Communication: Mobility status  GP     Mitchell Johnston 01/10/2013, 10:53 AM

## 2013-01-10 NOTE — Progress Notes (Signed)
   Subjective: 1 Day Post-Op Procedure(s) (LRB): RIGHT TOTAL HIP ARTHROPLASTY ANTERIOR APPROACH (Right)   Patient reports pain as mild, pain well controlled. No events throughout the night.   Objective:   VITALS:   Filed Vitals:   01/10/13 0604  BP: 127/74  Pulse: 66  Temp: 97.9 F (36.6 C)  Resp: 16    Neurovascular intact Dorsiflexion/Plantar flexion intact Incision: dressing C/D/I No cellulitis present Compartment soft  LABS  Recent Labs  01/10/13 0448  HGB 11.4*  HCT 31.8*  WBC 18.1*  PLT 219     Recent Labs  01/10/13 0448  NA 135  K 3.9  BUN 14  CREATININE 0.89  GLUCOSE 115*     Assessment/Plan: 1 Day Post-Op Procedure(s) (LRB): RIGHT TOTAL HIP ARTHROPLASTY ANTERIOR APPROACH (Right) Foley cath d/c'ed Advance diet Up with therapy D/C IV fluids Discharge home with home health Follow up in 2 weeks at [facility]. Follow up with OLIN,Chaney Maclaren D in 2 weeks.  Contact information:  [facility] [address] [place] [address] [phone]    Expected ABLA  Treated with iron and will observe        Anastasio Auerbach. Malerie Eakins   PAC  01/10/2013, 9:17 AM

## 2013-01-10 NOTE — Care Management Note (Addendum)
    Page 1 of 2   01/11/2013     4:10:18 PM   CARE MANAGEMENT NOTE 01/11/2013  Patient:  Mitchell Johnston, Mitchell Johnston   Account Number:  [id number]  Date Initiated:  01/10/2013  Documentation initiated by:  Colleen Can  Subjective/Objective Assessment:   dx rt total hip replacemnt: anterior approach     Action/Plan:   CM spoke with patient. Plans arefor patient to return to his home in [place] where his Mother will be caregiver. He will need RW and 3n1. Requesting [facility] for [facility] services.   Anticipated DC Date:  01/11/2013   Anticipated DC Plan:  HOME W HOME HEALTH SERVICES      DC Planning Services  CM consult      PAC Choice  DURABLE MEDICAL EQUIPMENT  HOME HEALTH   Choice offered to / List presented to:  C-1 Patient   DME arranged  3-N-1  Levan Hurst      DME agency  [facility]     HH arranged  HH-2 PT      [facility] agency  [facility]   Status of service:  Completed, signed off Medicare Important Message given?   (If response is "NO", the following Medicare IM given date fields will be blank) Date Medicare IM given:   Date Additional Medicare IM given:    Discharge Disposition:  HOME W HOME HEALTH SERVICES  Per UR Regulation:  Reviewed for med. necessity/level of care/duration of stay  If discussed at Long Length of Stay Meetings, dates discussed:    Comments:  01/10/2013 Colleen Can BSN RN CCM [phone] [facility] requested for services. States they can provide [facility] services with start of day after discharge.

## 2013-01-11 ENCOUNTER — Encounter (HOSPITAL_COMMUNITY): Payer: Self-pay | Admitting: Orthopedic Surgery

## 2013-01-11 LAB — BASIC METABOLIC PANEL
BUN: 14 mg/dL (ref 6–23)
Chloride: 104 mEq/L (ref 96–112)
GFR calc Af Amer: >90 mL/min (ref >90)
Glucose, Bld: 117 mg/dL — ABNORMAL HIGH (ref 70–99)
Potassium: 3.9 mEq/L (ref 3.5–5.1)
Sodium: 136 mEq/L (ref 135–145)

## 2013-01-11 LAB — CBC
HCT: 29.7 % — ABNORMAL LOW (ref 39.0–52.0)
Hemoglobin: 10.7 g/dL — ABNORMAL LOW (ref 13.0–17.0)
WBC: 20.8 10*3/uL — ABNORMAL HIGH (ref 4.0–10.5)

## 2013-01-11 NOTE — Progress Notes (Signed)
Pt stable, scripts, d/c instructions given with no questions/concerns voiced by pt or family.  Pt transported via wheelchair to private vehicle by NT and mother.

## 2013-01-11 NOTE — Progress Notes (Signed)
Physical Therapy Treatment Patient Details Name: Mitchell Johnston MRN: [id number] DOB: 02-03-1961 Today's Date: 01/11/2013 Time: 4098-1191 PT Time Calculation (min): 33 min  PT Assessment / Plan / Recommendation  History of Present Illness pt was admitted for R DA THA   PT Comments     Follow Up Recommendations  Home health PT     Does the patient have the potential to tolerate intense rehabilitation     Barriers to Discharge        Equipment Recommendations  Rolling walker with 5" wheels    Recommendations for Other Services OT consult  Frequency 7X/week   Progress towards PT Goals Progress towards PT goals: Progressing toward goals  Plan Current plan remains appropriate    Precautions / Restrictions Precautions Precautions: Fall Restrictions Weight Bearing Restrictions: No   Pertinent Vitals/Pain Min c/o pain; premed, ice pack provided    Mobility  Bed Mobility Bed Mobility: Supine to Sit Supine to Sit: 5: Supervision Details for Bed Mobility Assistance: cues for sequence and use of L LE to self assist Transfers Transfers: Sit to Stand;Stand to Sit Sit to Stand: 5: Supervision Stand to Sit: 5: Supervision Details for Transfer Assistance: cues for hand and leg placement Ambulation/Gait Ambulation/Gait Assistance: 5: Supervision Ambulation Distance (Feet): 550 Feet Assistive device: Rolling walker Ambulation/Gait Assistance Details: min cues for position from RW Gait Pattern: Step-through pattern Stairs: Yes Stairs Assistance: 4: Min guard Stairs Assistance Details (indicate cue type and reason): cues for sequencing Stair Management Technique: One rail Right;Forwards;Step to pattern Number of Stairs: 8    Exercises Total Joint Exercises Ankle Circles/Pumps: AROM;Supine;Both;20 reps Quad Sets: AROM;Both;10 reps;Supine Gluteal Sets: AROM;Both;10 reps;Supine Heel Slides: AAROM;Supine;Right;20 reps Hip ABduction/ADduction: AAROM;Right;Supine;20  reps Marching in Standing: AROM;10 reps;Right;Standing Standing Hip Extension: AROM;10 reps;Right;Standing   PT Diagnosis:    PT Problem List:   PT Treatment Interventions:     PT Goals (current goals can now be found in the care plan section) Acute Rehab PT Goals Patient Stated Goal: Resume previous lifestyle with decreased pain PT Goal Formulation: With patient Time For Goal Achievement: 01/13/13 Potential to Achieve Goals: Good  Visit Information  Last PT Received On: 01/11/13 Assistance Needed: +1 History of Present Illness: pt was admitted for R DA THA    Subjective Data  Patient Stated Goal: Resume previous lifestyle with decreased pain   Cognition  Cognition Arousal/Alertness: Awake/alert Behavior During Therapy: WFL for tasks assessed/performed Overall Cognitive Status: Within Functional Limits for tasks assessed    Balance     End of Session PT - End of Session Equipment Utilized During Treatment: Gait belt Activity Tolerance: Patient tolerated treatment well Patient left: in chair;with call bell/phone within reach Nurse Communication: Mobility status   GP     Julyssa Kyer 01/11/2013, 12:44 PM

## 2013-01-11 NOTE — Progress Notes (Signed)
   Subjective: 2 Days Post-Op Procedure(s) (LRB): RIGHT TOTAL HIP ARTHROPLASTY ANTERIOR APPROACH (Right)   Patient reports pain as mild, pain well controlled. No events throughout the night. Ready to be discharged home.  Objective:   VITALS:   Filed Vitals:   01/11/13 0500  BP: 123/75  Pulse: 63  Temp: 98.4 F (36.9 C)  Resp: 16    Neurovascular intact Dorsiflexion/Plantar flexion intact Incision: dressing C/D/I No cellulitis present Compartment soft  LABS  Recent Labs  01/10/13 0448 01/11/13 0505  HGB 11.4* 10.7*  HCT 31.8* 29.7*  WBC 18.1* 20.8*  PLT 219 197     Recent Labs  01/10/13 0448 01/11/13 0505  NA 135 136  K 3.9 3.9  BUN 14 14  CREATININE 0.89 1.02  GLUCOSE 115* 117*     Assessment/Plan: 2 Days Post-Op Procedure(s) (LRB): RIGHT TOTAL HIP ARTHROPLASTY ANTERIOR APPROACH (Right) Up with therapy Discharge home with home health Follow up in 2 weeks at [facility]. Follow up with OLIN,Kasidy Gianino D in 2 weeks.  Contact information:  [facility] [address] [place] [address] [phone]    Expected ABLA  Treated with iron and will observe     Anastasio Auerbach. Kehinde Totzke   PAC  01/11/2013, 7:37 AM

## 2013-01-11 NOTE — Progress Notes (Signed)
Came to visit Mitchell Johnston on behalf of [employer] to [employer] program for [employer] employees/dependents with [employer]. Agreeable to post hospital discharge call and will "think about joining [facility]" for his HTN. States he really does not think he needs further follow up for his blood pressure however. He is supposed to discharge home today with home health services. States his mother and brother will stay with him post hospital discharge when he goes home today for a short period of time. Discussed that he will be able to obtain ensure supplement from [facility] for a low cost. Will bring a case to his room prior to discharge today. Confirmed contact number and he will receive post hospital discharge call. Appreciative of visit.  Raiford Noble, MSN- Ed, Charity fundraiser, BSN- [facility] [phone]

## 2013-01-11 NOTE — Discharge Summary (Signed)
Physician Discharge Summary  Patient ID: Mitchell Johnston MRN: [id number] DOB/AGE: November 19, 1960 52 y.o.  Admit date: 01/09/2013 Discharge date:  01/11/2013  Procedures:  Procedure(s) (LRB): RIGHT TOTAL HIP ARTHROPLASTY ANTERIOR APPROACH (Right)  Attending Physician:  Dr. Durene Romans   Admission Diagnoses:   Right hip OA / pain  Discharge Diagnoses:  Principal Problem:   S/P right THA, AA Active Problems:   Expected blood loss anemia  Past Medical History  Diagnosis Date  . Hypertension   . Heartburn   . Arthritis   . Hemorrhoids     HPI: Mitchell Johnston, 52 y.o. male, has a history of pain and functional disability in the right hip(s) due to arthritis and patient has failed non-surgical conservative treatments for greater than 12 weeks to include NSAID's and/or analgesics and activity modification. Onset of symptoms was gradual starting 5+ years ago with gradually worsening course since that time.The patient noted no past surgery on the right hip(s). Patient currently rates pain in the right hip at 7 out of 10 with activity. Patient has worsening of pain with activity and weight bearing, trendelenberg gait, pain that interfers with activities of daily living, pain with passive range of motion and crepitus. Patient has evidence of periarticular osteophytes and joint space narrowing by imaging studies. This condition presents safety issues increasing the risk of falls. There is no current active infection. Risks, benefits and expectations were discussed with the patient. Patient understand the risks, benefits and expectations and wishes to proceed with surgery.   PCP: Willey Blade, MD   Discharged Condition: good  Hospital Course:  Patient underwent the above stated procedure on 01/09/2013. Patient tolerated the procedure well and brought to the recovery room in good condition and subsequently to the floor.  POD #1 BP: 127/74 ; Pulse: 66 ; Temp: 97.9 F (36.6 C) ; Resp:  16 Pt's foley was removed. IV was changed to a saline lock. Patient reports pain as mild, pain well controlled. No events throughout the night.  Neurovascular intact, dorsiflexion/plantar flexion intact, incision: dressing C/D/I, no cellulitis present and compartment soft.   LABS  Basename    HGB  11.4  HCT  31.8   POD #2  BP: 123/75 ; Pulse: 63 ; Temp: 98.4 F (36.9 C) ; Resp: 16  Patient reports pain as mild, pain well controlled. No events throughout the night. Ready to be discharged home. Neurovascular intact, dorsiflexion/plantar flexion intact, incision: dressing C/D/I, no cellulitis present and compartment soft.   LABS  Basename    HGB  10.7  HCT  29.7    Discharge Exam: General appearance: alert, cooperative and no distress Extremities: Homans sign is negative, no sign of DVT, no edema, redness or tenderness in the calves or thighs and no ulcers, gangrene or trophic changes  Disposition: Home with follow up in 2 weeks   Follow-up Information   Follow up with Shelda Pal, MD. Schedule an appointment as soon as possible for a visit in 2 weeks.   Specialty:  Orthopedic Surgery   Contact information:   [address] [place] [address] [phone]       Discharge Orders   Future Orders Complete By Expires   Call MD / Call 911  As directed    Comments:     If you experience chest pain or shortness of breath, CALL 911 and be transported to the hospital emergency room.  If you develope a fever above 101 F, pus (white drainage) or  increased drainage or redness at the wound, or calf pain, call your surgeon's office.   Change dressing  As directed    Comments:     Maintain surgical dressing for 10-14 days, then replace with 4x4 guaze and tape. Keep the area dry and clean.   Constipation Prevention  As directed    Comments:     Drink plenty of fluids.  Prune juice may be helpful.  You may use a stool softener, such as Colace (over the counter) 100  mg twice a day.  Use MiraLax (over the counter) for constipation as needed.   Diet - low sodium heart healthy  As directed    Discharge instructions  As directed    Comments:     Maintain surgical dressing for 10-14 days, then replace with gauze and tape. Keep the area dry and clean until follow up. Follow up in 2 weeks at [facility]. Call with any questions or concerns.   Increase activity slowly as tolerated  As directed    TED hose  As directed    Comments:     Use stockings (TED hose) for 2 weeks on both leg(s).  You may remove them at night for sleeping.   Weight bearing as tolerated  As directed    Questions:     Laterality:     Extremity:          Medication List    STOP taking these medications       meloxicam 7.5 MG tablet  Commonly known as:  MOBIC      TAKE these medications       aspirin 325 MG EC tablet  Take 1 tablet (325 mg total) by mouth 2 (two) times daily.     DSS 100 MG Caps  Take 100 mg by mouth 2 (two) times daily.     ferrous sulfate 325 (65 FE) MG tablet  Take 1 tablet (325 mg total) by mouth 3 (three) times daily after meals.     HYDROcodone-acetaminophen 7.5-325 MG per tablet  Commonly known as:  NORCO  Take 1-2 tablets by mouth every 4 (four) hours as needed for pain.     methocarbamol 500 MG tablet  Commonly known as:  ROBAXIN  Take 1 tablet (500 mg total) by mouth every 6 (six) hours as needed (muscle spasms).     polyethylene glycol packet  Commonly known as:  MIRALAX / GLYCOLAX  Take 17 g by mouth 2 (two) times daily.     triamterene-hydrochlorothiazide 37.5-25 MG per tablet  Commonly known as:  MAXZIDE-25  Take 1 tablet by mouth every morning.         Signed: Anastasio Auerbach. Ashten Prats   PAC  01/11/2013, 3:32 PM

## 2015-01-12 IMAGING — CR DG CHEST 2V
2 series · 2 of 2 positions shown · non-contrast
Comparison: None.

CLINICAL DATA: Preoperative evaluation for hip surgery

EXAM:
CHEST  2 VIEW

[w chest pa]
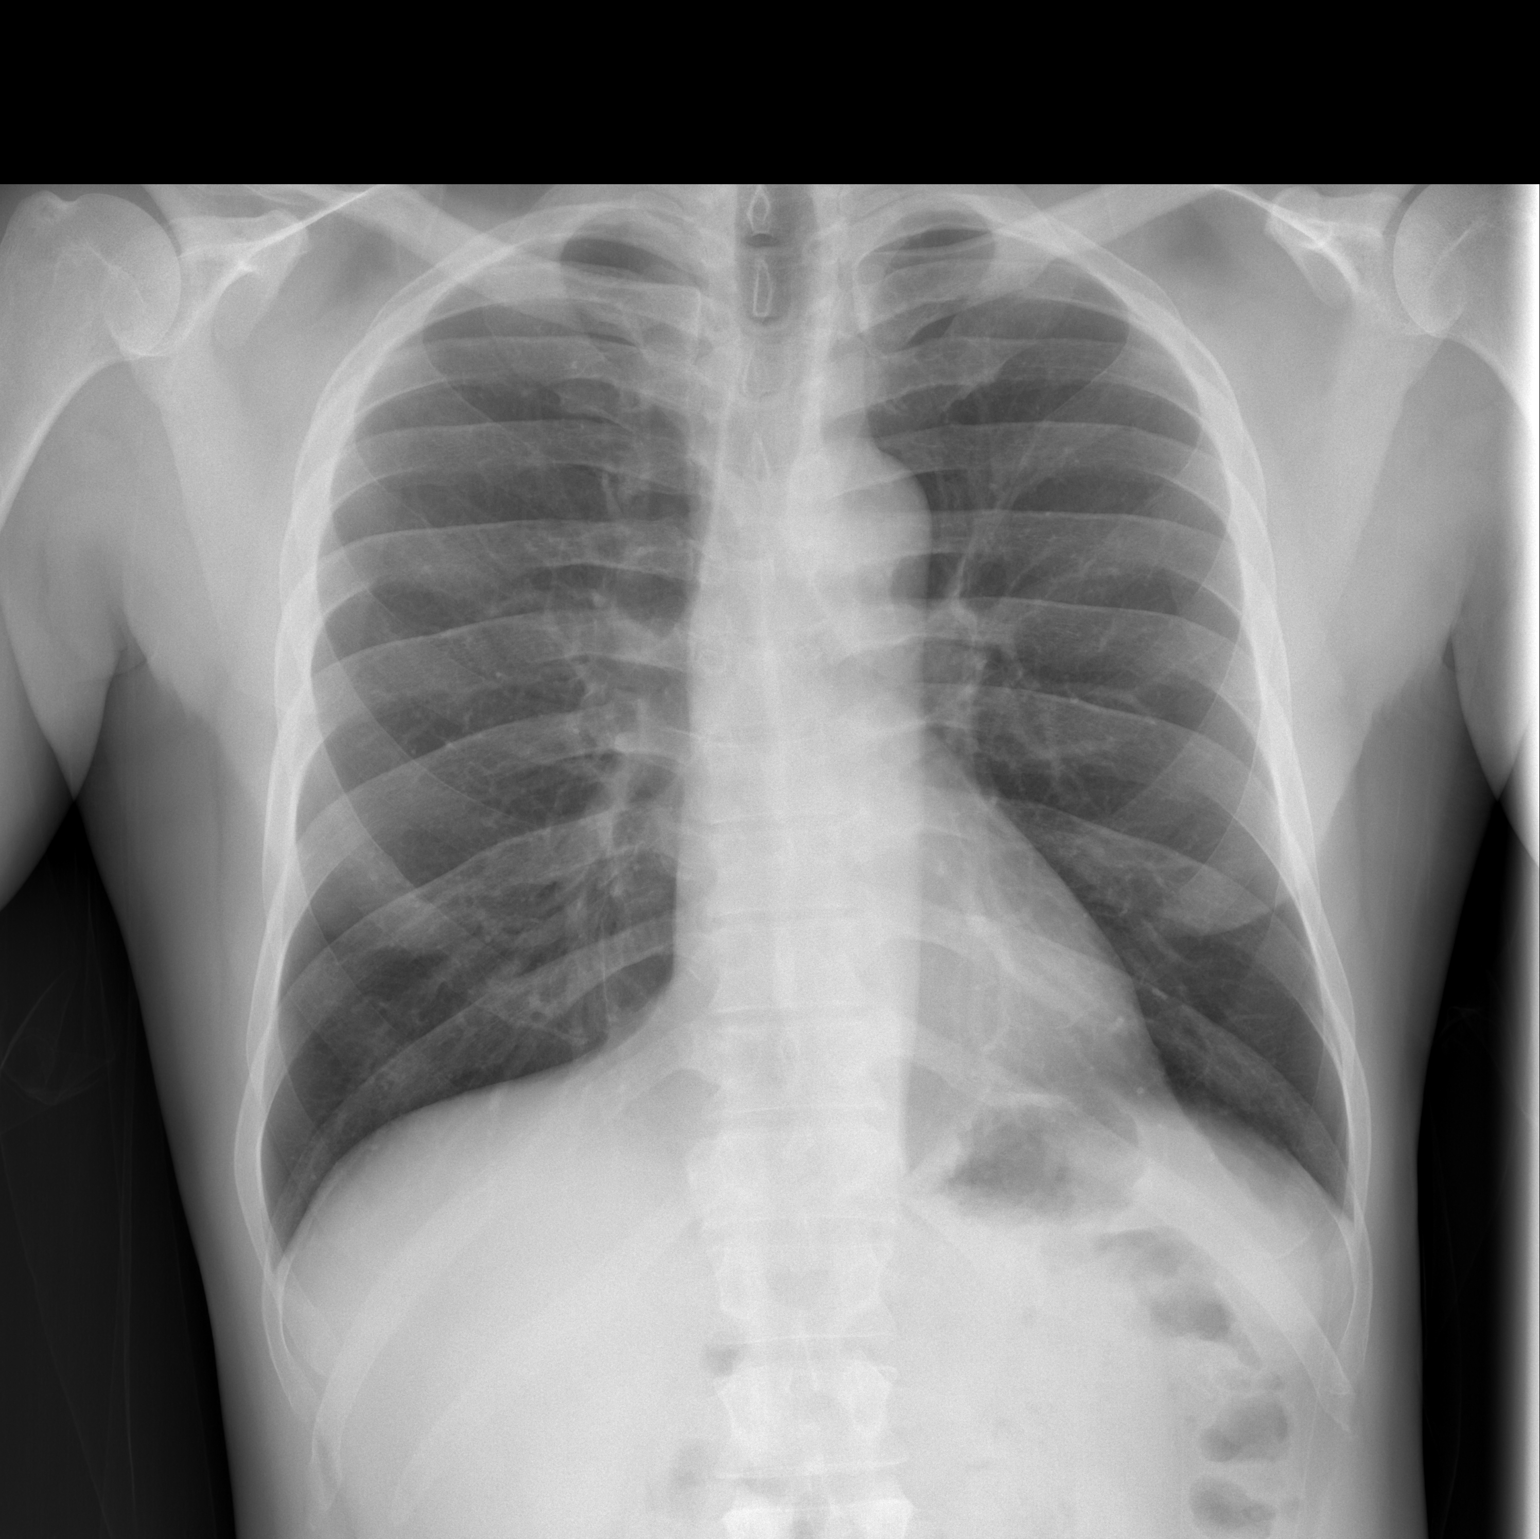

[w chest lat]
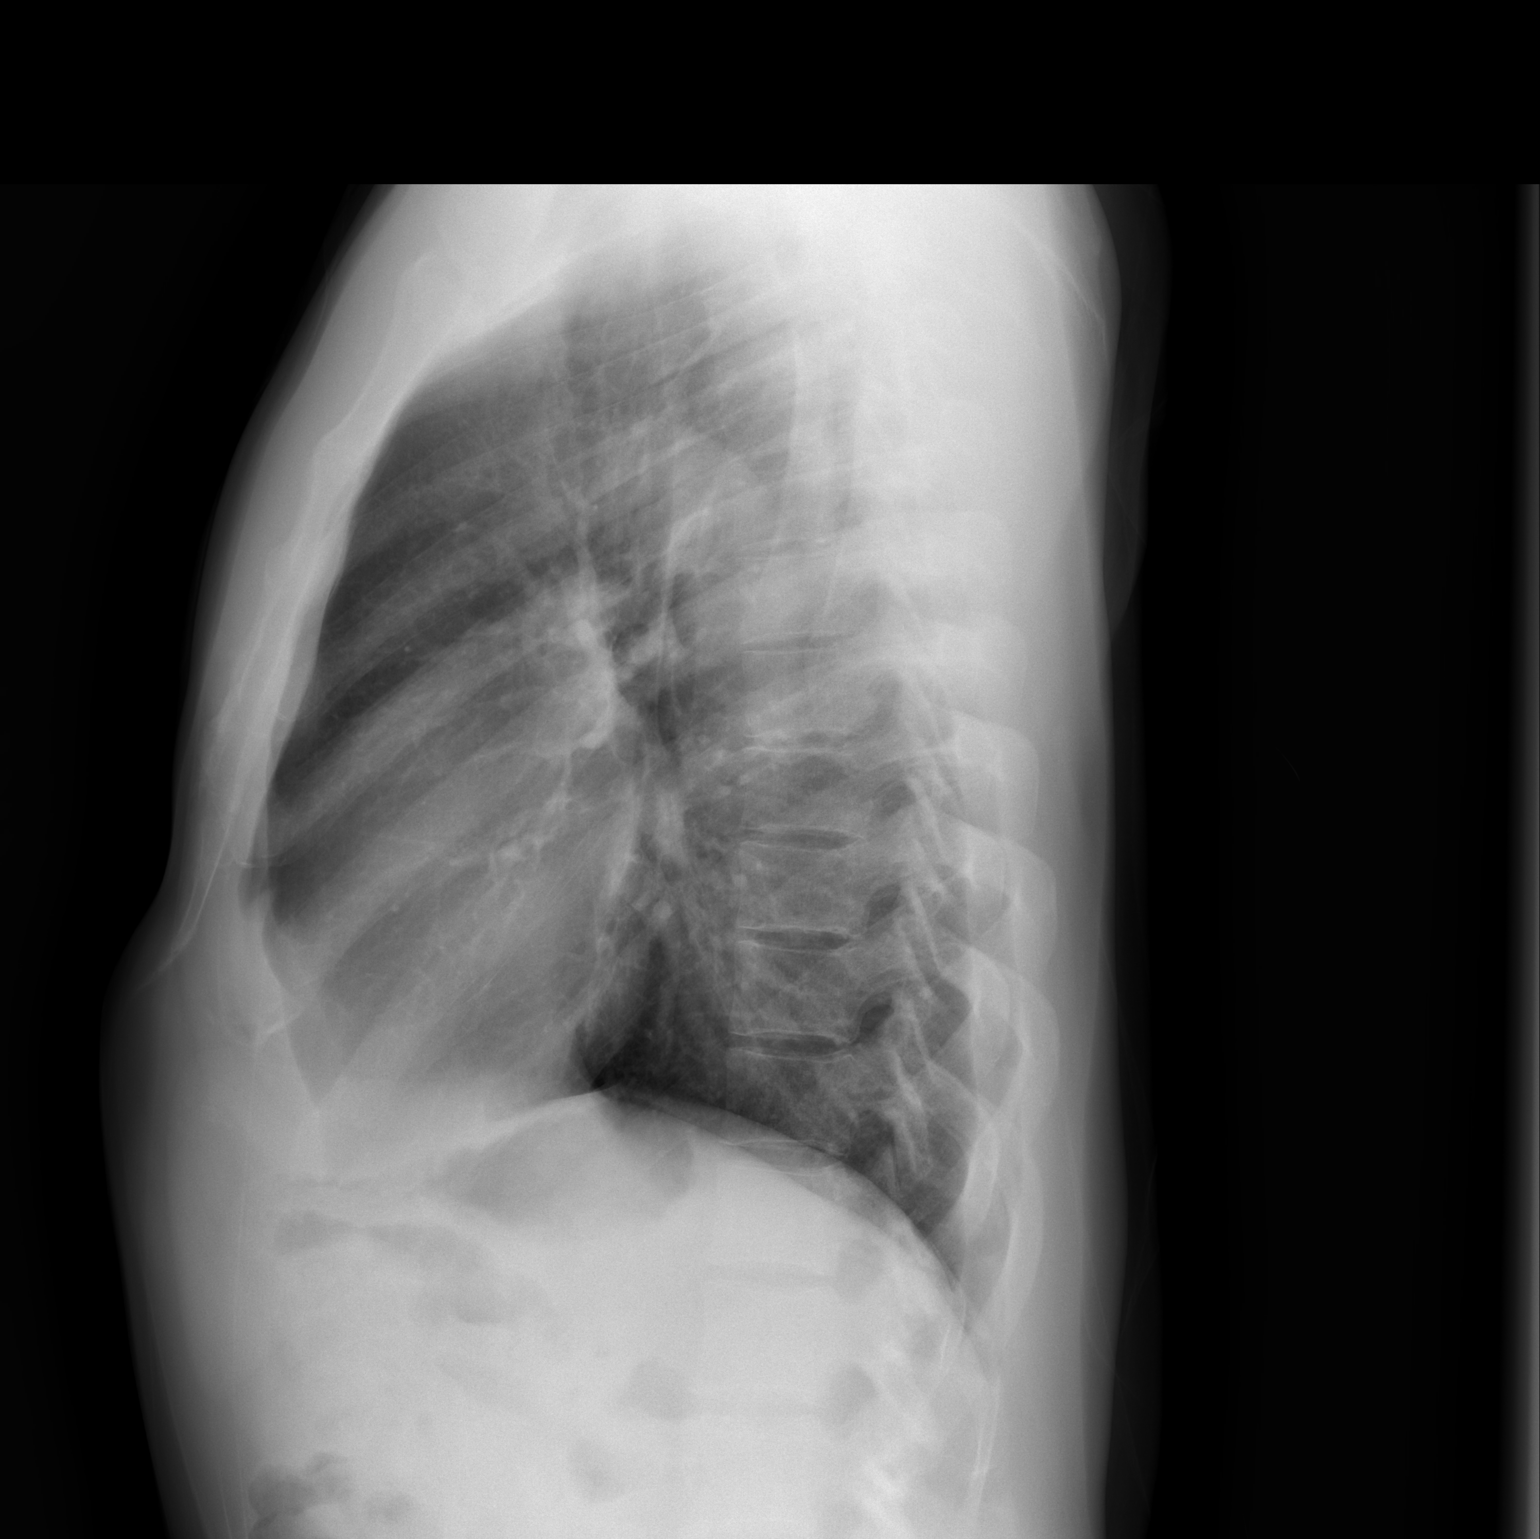

[2 of 2 positions shown; findings below may reference images not displayed]

FINDINGS: The heart size and mediastinal contours are within normal limits.
Both lungs are clear. The visualized skeletal structures are
unremarkable.
IMPRESSION: No active cardiopulmonary disease.

## 2017-09-24 ENCOUNTER — Encounter: Payer: Self-pay | Admitting: Family Medicine

## 2017-09-24 ENCOUNTER — Emergency Department (HOSPITAL_COMMUNITY)
Admission: EM | Admit: 2017-09-24 | Discharge: 2017-09-24 | Disposition: A | Discharge status: Home / Self Care | Payer: 59 | Attending: Emergency Medicine | Admitting: Emergency Medicine

## 2017-09-24 ENCOUNTER — Other Ambulatory Visit: Payer: Self-pay

## 2017-09-24 ENCOUNTER — Ambulatory Visit (INDEPENDENT_AMBULATORY_CARE_PROVIDER_SITE_OTHER): Payer: Self-pay | Admitting: Family Medicine

## 2017-09-24 ENCOUNTER — Encounter (HOSPITAL_COMMUNITY): Payer: Self-pay | Admitting: *Deleted

## 2017-09-24 VITALS — BP 205/132 | HR 88 | Temp 98.5°F | Resp 22 | Wt 167.6 lb

## 2017-09-24 DIAGNOSIS — Z79899 Other long term (current) drug therapy: Secondary | ICD-10-CM | POA: Insufficient documentation

## 2017-09-24 DIAGNOSIS — I10 Essential (primary) hypertension: Secondary | ICD-10-CM | POA: Insufficient documentation

## 2017-09-24 DIAGNOSIS — Z0000 Encounter for general adult medical examination without abnormal findings: Secondary | ICD-10-CM

## 2017-09-24 LAB — URINALYSIS, ROUTINE W REFLEX MICROSCOPIC
Bilirubin Urine: NEGATIVE
Glucose, UA: NEGATIVE mg/dL
Hgb urine dipstick: NEGATIVE
Ketones, ur: NEGATIVE mg/dL
LEUKOCYTES UA: NEGATIVE
Nitrite: NEGATIVE
PROTEIN: NEGATIVE mg/dL
Specific Gravity, Urine: 1.014 (ref 1.005–1.030)
pH: 6 (ref 5.0–8.0)

## 2017-09-24 LAB — CBC WITH DIFFERENTIAL/PLATELET
Basophils Absolute: 0 10*3/uL (ref 0.0–0.1)
Basophils Relative: 0 %
EOS ABS: 0.1 10*3/uL (ref 0.0–0.7)
Eosinophils Relative: 1 %
HEMATOCRIT: 39.3 % (ref 39.0–52.0)
Hemoglobin: 13.5 g/dL (ref 13.0–17.0)
Lymphocytes Relative: 15 %
Lymphs Abs: 1.4 10*3/uL (ref 0.7–4.0)
MCH: 28.4 pg (ref 26.0–34.0)
MCHC: 34.4 g/dL (ref 30.0–36.0)
MCV: 82.7 fL (ref 78.0–100.0)
MONO ABS: 1.1 10*3/uL — AB (ref 0.1–1.0)
MONOS PCT: 11 %
Neutro Abs: 7 10*3/uL (ref 1.7–7.7)
Neutrophils Relative %: 73 %
Platelets: 257 10*3/uL (ref 150–400)
RBC: 4.75 MIL/uL (ref 4.22–5.81)
RDW: 16.1 % — ABNORMAL HIGH (ref 11.5–15.5)
WBC: 9.6 10*3/uL (ref 4.0–10.5)

## 2017-09-24 LAB — COMPREHENSIVE METABOLIC PANEL
ALBUMIN: 3.3 g/dL — AB (ref 3.5–5.0)
ALK PHOS: 73 U/L (ref 38–126)
ALT: 20 U/L (ref 0–44)
AST: 18 U/L (ref 15–41)
Anion gap: 5 (ref 5–15)
BUN: 14 mg/dL (ref 6–20)
CO2: 27 mmol/L (ref 22–32)
CREATININE: 0.99 mg/dL (ref 0.61–1.24)
Calcium: 8.7 mg/dL — ABNORMAL LOW (ref 8.9–10.3)
Chloride: 109 mmol/L (ref 98–111)
GFR calc Af Amer: >60 mL/min (ref >60)
GFR calc non Af Amer: >60 mL/min (ref >60)
GLUCOSE: 98 mg/dL (ref 70–99)
Potassium: 4 mmol/L (ref 3.5–5.1)
Sodium: 141 mmol/L (ref 135–145)
Total Bilirubin: 0.8 mg/dL (ref 0.3–1.2)
Total Protein: 7.1 g/dL (ref 6.5–8.1)

## 2017-09-24 LAB — TROPONIN I: Troponin I: <0.03 ng/mL (ref <0.03)

## 2017-09-24 LAB — RAPID URINE DRUG SCREEN, HOSP PERFORMED
Amphetamines: NOT DETECTED
Benzodiazepines: NOT DETECTED
Cocaine: NOT DETECTED
OPIATES: NOT DETECTED
Tetrahydrocannabinol: NOT DETECTED

## 2017-09-24 MED ORDER — SODIUM CHLORIDE 0.9 % IV SOLN
INTRAVENOUS | Status: DC
  Administered 2017-09-24: 13:00:00 via INTRAVENOUS

## 2017-09-24 MED ORDER — AMLODIPINE BESYLATE 5 MG PO TABS: 5.0000 mg | ORAL_TABLET | Freq: Every day | ORAL | 3 refills | Status: DC

## 2017-09-24 NOTE — ED Notes (Signed)
rx x 1 given 

## 2017-09-24 NOTE — Progress Notes (Signed)
Mitchell Johnston is a 57 y.o. male who presents today with concerns of need for an employee annual physical exam. Patient reports that they have a primary care provider who manages their care and chronic health conditions. Patient denies any acute conditions that need to be addressed today this visit. Due to patients triage and elevated blood pressure checked x 3 with values greater than 200/130 each time. PATIENT SENT DIRECTLY TO ED-   Review of Systems  Constitutional: Negative for chills, fever and malaise/fatigue.  HENT: Negative for congestion, ear discharge, ear pain, sinus pain and sore throat.   Eyes: Negative.   Respiratory: Negative for cough, sputum production and shortness of breath.   Cardiovascular: Negative.  Negative for chest pain.  Gastrointestinal: Negative for abdominal pain, diarrhea, nausea and vomiting.  Genitourinary: Negative for dysuria, frequency, hematuria and urgency.  Musculoskeletal: Negative for myalgias.  Skin: Negative.   Neurological: Negative for headaches.  Endo/Heme/Allergies: Negative.   Psychiatric/Behavioral: Negative.     O: Vitals:   09/24/17 1024  BP: (!) 202/138  Pulse: 88  Resp: (!) 22  Temp: 98.5 F (36.9 C)  SpO2: 99%     Physical Exam  Constitutional: He is oriented to person, place, and time. Vital signs are normal. He appears well-developed and well-nourished. He is active.  Non-toxic appearance. He does not have a sickly appearance.  HENT:  Head: Normocephalic.  Right Ear: Hearing, tympanic membrane, external ear and ear canal normal.  Left Ear: Hearing, tympanic membrane, external ear and ear canal normal.  Nose: Nose normal.  Mouth/Throat: Uvula is midline and oropharynx is clear and moist.  Neck: Normal range of motion. Neck supple.  Cardiovascular: Normal rate, regular rhythm, normal heart sounds and normal pulses.  Pulmonary/Chest: Effort normal and breath sounds normal.  Abdominal: Soft. Bowel sounds are normal.   Musculoskeletal: Normal range of motion.  Lymphadenopathy:       Head (right side): No submental and no submandibular adenopathy present.       Head (left side): No submental and no submandibular adenopathy present.    He has no cervical adenopathy.  Neurological: He is alert and oriented to person, place, and time.  Psychiatric: He has a normal mood and affect.  Vitals reviewed.  A: 1. Physical exam    P: Hypertension risks briefly discussed with patient who was instructed on the urgency of his transport to the ED- he was informed that EMS was a transport option but decided to drive- he was AAO x 3 at this time in NAD and otherwise asymptomatic. Physical exam completed and patient transferred.  PATIENT SENT DIRECTLY TO ED-  [facility] who would be [facility]. Verified in Epic that patient did arrive at ED for treatment. Exam findings, diagnosis etiology and medication use and indications reviewed with patient. Follow- Up and discharge instructions provided. No emergent/urgent issues found on exam.  Patient verbalized understanding of information provided and agrees with plan of care (POC), all questions answered.  1. Physical exam- BP elevated- otherwise WNL  2. Hypertension, unspecified type Sent to ED due to undiagnosed or untreated elevated blood pressure

## 2017-09-24 NOTE — ED Notes (Signed)
EKG PERFORMED.

## 2017-09-24 NOTE — ED Provider Notes (Signed)
[facility]-EMERGENCY DEPT Provider Note   CSN: [id number] Arrival date & time: 09/24/17  1057     History   Chief Complaint Chief Complaint  Patient presents with  . Hypertension    HPI Mitchell Johnston is a 57 y.o. male.  HPI   He is here for evaluation of hypertension, found today when he went for an insurance physical to a urgent care.  Initial blood pressure there was 205/132 and he was immediately sent to the ED, for evaluation after that.  Patient denies headache, chest pain, nausea, vomiting, blurred vision, focal weakness or paresthesia.  He recalls that he had hypertension 5 years ago when he had an orthopedic procedure and took medication for it, for 1 month, until it ran out.  He has not had his blood pressure check since then.  He works as a Network engineer.  He does not use illegal drugs or smoke tobacco.  There are no other known modifying factors.  Past Medical History:  Diagnosis Date  . Arthritis   . Heartburn   . Hemorrhoids   . Hypertension     Patient Active Problem List   Diagnosis Date Noted  . Expected blood loss anemia 01/10/2013  . 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 HERNIA REPAIR     inguinal  . TOTAL HIP ARTHROPLASTY Right 01/09/2013   Procedure: RIGHT TOTAL HIP ARTHROPLASTY ANTERIOR APPROACH;  Surgeon: Shelda Pal, MD;  Location: [facility];  Service: Orthopedics;  Laterality: Right;        Home Medications    Prior to Admission medications   Medication Sig Start Date End Date Taking? Authorizing Provider  Cholecalciferol (VITAMIN D PO) Take 1 tablet by mouth daily.   Yes [provider]  Multiple Vitamin (MULTIVITAMIN WITH MINERALS) TABS tablet Take 1 tablet by mouth daily.   Yes [provider]  amLODipine (NORVASC) 5 MG tablet Take 1 tablet (5 mg total) by mouth daily. 09/24/17   Mancel Bale, MD    Family History No family history on  file.  Social History Social History   Tobacco Use  . Smoking status: Never Smoker  . Smokeless tobacco: Never Used  Substance Use Topics  . Alcohol use: No  . Drug use: No     Allergies   Patient has no known allergies.   Review of Systems Review of Systems  All other systems reviewed and are negative.    Physical Exam Updated Vital Signs BP (!) 172/110 (BP Location: Left Arm)   Pulse 67   Temp 98.4 F (36.9 C) (Oral)   Resp 14   Ht 5\' 6"  (1.676 m)   SpO2 100%   BMI 27.05 kg/m   Physical Exam  Constitutional: He is oriented to person, place, and time. He appears well-developed and well-nourished.  HENT:  Head: Normocephalic and atraumatic.  Right Ear: External ear normal.  Left Ear: External ear normal.  Eyes: Pupils are equal, round, and reactive to light. Conjunctivae and EOM are normal.  Neck: Normal range of motion and phonation normal. Neck supple.  Cardiovascular: Normal rate, regular rhythm and normal heart sounds.  Pulmonary/Chest: Effort normal and breath sounds normal. He exhibits no bony tenderness.  Abdominal: Soft. There is no tenderness.  Musculoskeletal: Normal range of motion.  Neurological: He is alert and oriented to person, place, and time. No cranial nerve deficit or sensory deficit. He exhibits normal muscle tone. Coordination  normal.  No dysarthria, aphasia or nystagmus.  Skin: Skin is warm, dry and intact.  Psychiatric: He has a normal mood and affect. His behavior is normal. Judgment and thought content normal.  Nursing note and vitals reviewed.    ED Treatments / Results  Labs (all labs ordered are listed, but only abnormal results are displayed) Labs Reviewed  COMPREHENSIVE METABOLIC PANEL - Abnormal; Notable for the following components:      Result Value   Calcium 8.7 (*)    Albumin 3.3 (*)    All other components within normal limits  CBC WITH DIFFERENTIAL/PLATELET - Abnormal; Notable for the following components:   RDW  16.1 (*)    Monocytes Absolute 1.1 (*)    All other components within normal limits  RAPID URINE DRUG SCREEN, HOSP PERFORMED - Abnormal; Notable for the following components:   Barbiturates   (*)    Value: Result not available. Reagent lot number recalled by manufacturer.   All other components within normal limits  TROPONIN I  URINALYSIS, ROUTINE W REFLEX MICROSCOPIC    EKG EKG Interpretation  Date/Time:  Saturday September 24 2017 11:36:40 EDT Ventricular Rate:  71 PR Interval:    QRS Duration: 109 QT Interval:  461 QTC Calculation: 501 R Axis:   24 Text Interpretation:  Sinus arrhythmia Minimal ST elevation, anterior leads Prolonged QT interval since last tracing no significant change Confirmed by Mancel BaleWentz, Brianni Manthe [phone]) on 09/24/2017 3:02:22 PM   Radiology No results found.  Procedures Procedures (including critical care time)  Medications Ordered in ED Medications - No data to display   Initial Impression / Assessment and Plan / ED Course  I have reviewed the triage vital signs and the nursing notes.  Pertinent labs & imaging results that were available during my care of the patient were reviewed by me and considered in my medical decision making (see chart for details).      No data found.  1:59 PM Reevaluation with update and discussion. After initial assessment and treatment, an updated evaluation reveals he remains comfortable, and has no further complaints.Mancel Bale. Derwin Reddy   Medical Decision Making: Hypertension without signs of endorgan damage.  Doubt hypertensive urgency, metabolic instability or impending vascular collapse.  CRITICAL CARE-no Performed by: Mancel BaleElliott Tattianna Schnarr   Nursing Notes Reviewed/ Care Coordinated Applicable Imaging Reviewed Interpretation of Laboratory Data incorporated into ED treatment  The patient appears reasonably screened and/or stabilized for discharge and I doubt any other medical condition or other [facility] requiring further screening,  evaluation, or treatment in the ED at this time prior to discharge.  Plan: Home Medications- OTC prn; Home Treatments- DASH diet; return here if the recommended treatment, does not improve the symptoms; Recommended follow up- PCP 1-2 weeks for ongoing care     Final Clinical Impressions(s) / ED Diagnoses   Final diagnoses:  Hypertension, unspecified type    ED Discharge Orders        Ordered    amLODipine (NORVASC) 5 MG tablet  Daily     09/24/17 1407       Mancel BaleWentz, Charan Prieto, MD 09/25/17 1756

## 2017-09-24 NOTE — Patient Instructions (Addendum)
PATIENT ADVISED TO GOT DIRECTLY TO ED Health Maintenance, Male A healthy lifestyle and preventive care is important for your health and wellness. Ask your health care provider about what schedule of regular examinations is right for you. What should I know about weight and diet? Eat a Healthy Diet  Eat plenty of vegetables, fruits, whole grains, low-fat dairy products, and lean protein.  Do not eat a lot of foods high in solid fats, added sugars, or salt.  Maintain a Healthy Weight Regular exercise can help you achieve or maintain a healthy weight. You should:  Do at least 150 minutes of exercise each week. The exercise should increase your heart rate and make you sweat (moderate-intensity exercise).  Do strength-training exercises at least twice a week.  Watch Your Levels of Cholesterol and Blood Lipids  Have your blood tested for lipids and cholesterol every 5 years starting at 59 years of age. If you are at high risk for heart disease, you should start having your blood tested when you are 59 years old. You may need to have your cholesterol levels checked more often if: ? Your lipid or cholesterol levels are high. ? You are older than 59 years of age. ? You are at high risk for heart disease.  What should I know about cancer screening? Many types of cancers can be detected early and may often be prevented. Lung Cancer  You should be screened every year for lung cancer if: ? You are a current smoker who has smoked for at least 30 years. ? You are a former smoker who has quit within the past 15 years.  Talk to your health care provider about your screening options, when you should start screening, and how often you should be screened.  Colorectal Cancer  Routine colorectal cancer screening usually begins at 59 years of age and should be repeated every 5-10 years until you are 59 years old. You may need to be screened more often if early forms of precancerous polyps or small  growths are found. Your health care provider may recommend screening at an earlier age if you have risk factors for colon cancer.  Your health care provider may recommend using home test kits to check for hidden blood in the stool.  A small camera at the end of a tube can be used to examine your colon (sigmoidoscopy or colonoscopy). This checks for the earliest forms of colorectal cancer.  Prostate and Testicular Cancer  Depending on your age and overall health, your health care provider may do certain tests to screen for prostate and testicular cancer.  Talk to your health care provider about any symptoms or concerns you have about testicular or prostate cancer.  Skin Cancer  Check your skin from head to toe regularly.  Tell your health care provider about any new moles or changes in moles, especially if: ? There is a change in a mole's size, shape, or color. ? You have a mole that is larger than a pencil eraser.  Always use sunscreen. Apply sunscreen liberally and repeat throughout the day.  Protect yourself by wearing long sleeves, pants, a wide-brimmed hat, and sunglasses when outside.  What should I know about heart disease, diabetes, and high blood pressure?  If you are 6318-59 years of age, have your blood pressure checked every 3-5 years. If you are 59 years of age or older, have your blood pressure checked every year. You should have your blood pressure measured twice-once when you are at  a hospital or clinic, and once when you are not at a hospital or clinic. Record the average of the two measurements. To check your blood pressure when you are not at a hospital or clinic, you can use: ? An automated blood pressure machine at a pharmacy. ? A home blood pressure monitor.  Talk to your health care provider about your target blood pressure.  If you are between 72-79 years old, ask your health care provider if you should take aspirin to prevent heart disease.  Have regular  diabetes screenings by checking your fasting blood sugar level. ? If you are at a normal weight and have a low risk for diabetes, have this test once every three years after the age of 24. ? If you are overweight and have a high risk for diabetes, consider being tested at a younger age or more often.  A one-time screening for abdominal aortic aneurysm (AAA) by ultrasound is recommended for men aged 65-75 years who are current or former smokers. What should I know about preventing infection? Hepatitis B If you have a higher risk for hepatitis B, you should be screened for this virus. Talk with your health care provider to find out if you are at risk for hepatitis B infection. Hepatitis C Blood testing is recommended for:  Everyone born from [birth year] through 1967.  Anyone with known risk factors for hepatitis C.  Sexually Transmitted Diseases (STDs)  You should be screened each year for STDs including gonorrhea and chlamydia if: ? You are sexually active and are younger than 59 years of age. ? You are older than 59 years of age and your health care provider tells you that you are at risk for this type of infection. ? Your sexual activity has changed since you were last screened and you are at an increased risk for chlamydia or gonorrhea. Ask your health care provider if you are at risk.  Talk with your health care provider about whether you are at high risk of being infected with HIV. Your health care provider may recommend a prescription medicine to help prevent HIV infection.  What else can I do?  Schedule regular health, dental, and eye exams.  Stay current with your vaccines (immunizations).  Do not use any tobacco products, such as cigarettes, chewing tobacco, and e-cigarettes. If you need help quitting, ask your health care provider.  Limit alcohol intake to no more than 2 drinks per day. One drink equals 12 ounces of beer, 5 ounces of wine, or 1 ounces of hard liquor.  Do not use  street drugs.  Do not share needles.  Ask your health care provider for help if you need support or information about quitting drugs.  Tell your health care provider if you often feel depressed.  Tell your health care provider if you have ever been abused or do not feel safe at home. This information is not intended to replace advice given to you by your health care provider. Make sure you discuss any questions you have with your health care provider. Document Released: 09/11/2007 Document Revised: 11/12/2015 Document Reviewed: 12/17/2014 Elsevier Interactive Patient Education  2018 [employer].  Hypertension Hypertension is another name for high blood pressure. High blood pressure forces your heart to work harder to pump blood. This can cause problems over time. There are two numbers in a blood pressure reading. There is a top number (systolic) over a bottom number (diastolic). It is best to have a blood pressure below 120/80.  Healthy choices can help lower your blood pressure. You may need medicine to help lower your blood pressure if:  Your blood pressure cannot be lowered with healthy choices.  Your blood pressure is higher than 130/80.  Follow these instructions at home: Eating and drinking  If directed, follow the DASH eating plan. This diet includes: ? Filling half of your plate at each meal with fruits and vegetables. ? Filling one quarter of your plate at each meal with whole grains. Whole grains include whole wheat pasta, brown rice, and whole grain bread. ? Eating or drinking low-fat dairy products, such as skim milk or low-fat yogurt. ? Filling one quarter of your plate at each meal with low-fat (lean) proteins. Low-fat proteins include fish, skinless chicken, eggs, beans, and tofu. ? Avoiding fatty meat, cured and processed meat, or chicken with skin. ? Avoiding premade or processed food.  Eat less than 1,500 mg of salt (sodium) a day.  Limit alcohol use to no more  than 1 drink a day for nonpregnant women and 2 drinks a day for men. One drink equals 12 oz of beer, 5 oz of wine, or 1 oz of hard liquor. Lifestyle  Work with your doctor to stay at a healthy weight or to lose weight. Ask your doctor what the best weight is for you.  Get at least 30 minutes of exercise that causes your heart to beat faster (aerobic exercise) most days of the week. This may include walking, swimming, or biking.  Get at least 30 minutes of exercise that strengthens your muscles (resistance exercise) at least 3 days a week. This may include lifting weights or pilates.  Do not use any products that contain nicotine or tobacco. This includes cigarettes and e-cigarettes. If you need help quitting, ask your doctor.  Check your blood pressure at home as told by your doctor.  Keep all follow-up visits as told by your doctor. This is important. Medicines  Take over-the-counter and prescription medicines only as told by your doctor. Follow directions carefully.  Do not skip doses of blood pressure medicine. The medicine does not work as well if you skip doses. Skipping doses also puts you at risk for problems.  Ask your doctor about side effects or reactions to medicines that you should watch for. Contact a doctor if:  You think you are having a reaction to the medicine you are taking.  You have headaches that keep coming back (recurring).  You feel dizzy.  You have swelling in your ankles.  You have trouble with your vision. Get help right away if:  You get a very bad headache.  You start to feel confused.  You feel weak or numb.  You feel faint.  You get very bad pain in your: ? Chest. ? Belly (abdomen).  You throw up (vomit) more than once.  You have trouble breathing. Summary  Hypertension is another name for high blood pressure.  Making healthy choices can help lower blood pressure. If your blood pressure cannot be controlled with healthy choices, you  may need to take medicine. This information is not intended to replace advice given to you by your health care provider. Make sure you discuss any questions you have with your health care provider. Document Released: 09/01/2007 Document Revised: 02/11/2016 Document Reviewed: 02/11/2016 Elsevier Interactive Patient Education  [employer].

## 2017-09-24 NOTE — ED Notes (Signed)
ED Provider at bedside. WENTZ 

## 2017-09-24 NOTE — Discharge Instructions (Signed)
Follow-up with a primary care doctor as soon as possible for further management and treatment of high blood pressure.  Use the resource guide to help you find a doctor.  Stay on a low-salt diet and try to get some regular light exercise.

## 2017-09-24 NOTE — ED Notes (Signed)
PT PLACED ON BP MEDICATION IN 2014 HOWEVER DID NOT STAY ON BP MEDICATION. PT HAS NOT BE MONITORING BP. PT PRESENTS WITHOUT SYMPTOMS.  PT SEEN AT PCP TODAY FOR WELLNESS CHECK UP. ELEVATED BP OF APPROX.  210/140. HERE FOR EVALUATION OF ELEVATED BP.

## 2017-09-24 NOTE — ED Triage Notes (Signed)
[facility] bp 202/138 at triage 182/124.

## 2017-10-24 MED FILL — AMLODIPINE BESYLATE 5 MG TA: 5 | 30 days supply | Qty: 30 | Fill #0

## 2017-11-17 MED FILL — AMLODIPINE BESYLATE 5 MG TA: 5 | 30 days supply | Qty: 30 | Fill #1

## 2018-01-13 MED FILL — AMLODIPINE BESYLATE 5 MG TA: 5 | 30 days supply | Qty: 30 | Fill #2

## 2021-11-10 ENCOUNTER — Other Ambulatory Visit: Payer: Self-pay

## 2021-11-10 ENCOUNTER — Emergency Department (HOSPITAL_COMMUNITY)
Admission: EM | Admit: 2021-11-10 | Discharge: 2021-11-10 | Disposition: A | Discharge status: Home / Self Care | Payer: PRIVATE HEALTH INSURANCE | Attending: Emergency Medicine | Admitting: Emergency Medicine

## 2021-11-10 ENCOUNTER — Encounter (HOSPITAL_COMMUNITY): Payer: Self-pay

## 2021-11-10 DIAGNOSIS — S6992XA Unspecified injury of left wrist, hand and finger(s), initial encounter: Secondary | ICD-10-CM | POA: Diagnosis present

## 2021-11-10 DIAGNOSIS — Y990 Civilian activity done for income or pay: Secondary | ICD-10-CM | POA: Diagnosis not present

## 2021-11-10 DIAGNOSIS — Z23 Encounter for immunization: Secondary | ICD-10-CM | POA: Insufficient documentation

## 2021-11-10 DIAGNOSIS — S61512A Laceration without foreign body of left wrist, initial encounter: Secondary | ICD-10-CM | POA: Diagnosis not present

## 2021-11-10 DIAGNOSIS — Z79899 Other long term (current) drug therapy: Secondary | ICD-10-CM | POA: Diagnosis not present

## 2021-11-10 DIAGNOSIS — W268XXA Contact with other sharp object(s), not elsewhere classified, initial encounter: Secondary | ICD-10-CM | POA: Diagnosis not present

## 2021-11-10 MED ORDER — LIDOCAINE-EPINEPHRINE (PF) 2 %-1:200000 IJ SOLN
10.0000 mL | Freq: Once | INTRAMUSCULAR | Status: DC
  Filled 2021-11-10: qty 20

## 2021-11-10 MED ORDER — TETANUS-DIPHTH-ACELL PERTUSSIS 5-2.5-18.5 LF-MCG/0.5 IM SUSY
0.5000 mL | PREFILLED_SYRINGE | Freq: Once | INTRAMUSCULAR | Status: AC
  Administered 2021-11-10: 0.5 mL via INTRAMUSCULAR
  Filled 2021-11-10: qty 0.5

## 2021-11-10 MED ORDER — LIDOCAINE HCL (PF) 1 % IJ SOLN
5.0000 mL | Freq: Once | INTRAMUSCULAR | Status: AC
  Administered 2021-11-10: 5 mL
  Filled 2021-11-10: qty 30

## 2021-11-10 NOTE — Discharge Instructions (Signed)
Keep wound clean and dry. Recheck with employee health in 2 days. Suture removal in 10 days.

## 2021-11-10 NOTE — ED Provider Triage Note (Signed)
Emergency Medicine Provider Triage Evaluation Note  Mitchell Johnston , a 61 y.o. male  was evaluated in triage.  Pt complains of laceration that he sustained just prior to arrival.  Patient works at [employer].  Was using a box cutter when this happened.  Unsure of his last tetanus shot..  Review of Systems  Positive: As above Negative: As abov  Physical Exam  BP (!) 190/121 (BP Location: Right Arm)   Pulse 81   Temp 98.2 F (36.8 C) (Oral)   Resp 18   Ht 5\' 6"  (1.676 m)   Wt 73.5 kg   SpO2 100%   BMI 26.15 kg/m  Gen:   Awake, no distress   Resp:  Normal effort MSK:   Moves extremities without difficulty  Other:    Medical Decision Making  Medically screening exam initiated at 2:01 PM.  Appropriate orders placed.  MENDEL BINSFELD was informed that the remainder of the evaluation will be completed by another provider, this initial triage assessment does not replace that evaluation, and the importance of remaining in the ED until their evaluation is complete.     Iona Coach, PA-C 11/10/21 1402

## 2021-11-10 NOTE — ED Triage Notes (Signed)
Pt has small lac to left wrist. Pt reports accidentally cutting it with a box cutter while opening boxes at work. Unsure of last tetanus shot.

## 2021-11-10 NOTE — ED Provider Notes (Signed)
[facility]-EMERGENCY DEPT Provider Note   CSN: [id number] Arrival date & time: 11/10/21  1325     History  Chief Complaint  Patient presents with   Laceration    Mitchell Johnston is a 61 y.o. male.  61 year old male presents with laceration of left wrist.  Patient was at work when he accidentally cut his wrist with a box cutter.  Bleeding is controlled, not anticoagulated, denies weakness or numbness in the wrist or hand.  Last tetanus is unknown.  No other injuries, complaints, concerns.  Patient is right-hand dominant.       Home Medications Prior to Admission medications   Medication Sig Start Date End Date Taking? Authorizing Provider  amLODipine (NORVASC) 5 MG tablet Take 1 tablet (5 mg total) by mouth daily. 09/24/17   Mancel Bale, MD  Cholecalciferol (VITAMIN D PO) Take 1 tablet by mouth daily.    [provider]  Multiple Vitamin (MULTIVITAMIN WITH MINERALS) TABS tablet Take 1 tablet by mouth daily.    [provider]      Allergies    Patient has no known allergies.    Review of Systems   Review of Systems Negative except as per HPI Physical Exam Updated Vital Signs BP (!) 190/121 (BP Location: Right Arm)   Pulse 81   Temp 98.2 F (36.8 C) (Oral)   Resp 18   Ht 5\' 6"  (1.676 m)   Wt 73.5 kg   SpO2 100%   BMI 26.15 kg/m  Physical Exam Vitals and nursing note reviewed.  Constitutional:      General: He is not in acute distress.    Appearance: He is well-developed. He is not diaphoretic.  HENT:     Head: Normocephalic and atraumatic.  Cardiovascular:     Pulses: Normal pulses.  Pulmonary:     Effort: Pulmonary effort is normal.  Musculoskeletal:        General: Signs of injury present. No swelling or deformity.     Comments: 2cm linear laceration to left wrist radial aspect   Skin:    General: Skin is warm and dry.     Capillary Refill: Capillary refill takes less than 2 seconds.  Neurological:     Mental  Status: He is alert and oriented to person, place, and time.     Sensory: No sensory deficit.     Motor: No weakness.  Psychiatric:        Behavior: Behavior normal.     ED Results / Procedures / Treatments   Labs (all labs ordered are listed, but only abnormal results are displayed) Labs Reviewed - No data to display  EKG None  Radiology No results found.  Procedures . Laceration Repair  Date/Time: 11/10/2021 2:52 PM  Performed by: 11/12/2021, PA-C Authorized by: Jeannie Fend, PA-C   Consent:    Consent obtained:  Verbal   Consent given by:  Patient   Risks discussed:  Infection, need for additional repair, pain, poor cosmetic result and poor wound healing   Alternatives discussed:  No treatment and delayed treatment Universal protocol:    Procedure explained and questions answered to patient or proxy's satisfaction: yes     Relevant documents present and verified: yes     Test results available: yes     Imaging studies available: yes     Required blood products, implants, devices, and special equipment available: yes     Site/side marked: yes     Immediately prior to  procedure, a time out was called: yes     Patient identity confirmed:  Verbally with patient Anesthesia:    Anesthesia method:  Local infiltration   Local anesthetic:  Lidocaine 1% w/o epi Laceration details:    Location:  Hand   Hand location:  L wrist   Length (cm):  2   Depth (mm):  3 Pre-procedure details:    Preparation:  Patient was prepped and draped in usual sterile fashion Exploration:    Hemostasis achieved with:  Direct pressure   Wound exploration: wound explored through full range of motion and entire depth of wound visualized     Wound extent: no foreign bodies/material noted, no muscle damage noted, no nerve damage noted, no tendon damage noted, no underlying fracture noted and no vascular damage noted     Contaminated: no   Treatment:    Area cleansed with:  Saline    Amount of cleaning:  Standard Skin repair:    Repair method:  Sutures   Suture size:  4-0   Suture material:  Prolene   Suture technique:  Simple interrupted   Number of sutures:  2 Approximation:    Approximation:  Close Repair type:    Repair type:  Simple Post-procedure details:    Dressing:  Adhesive bandage   Procedure completion:  Tolerated well, no immediate complications     Medications Ordered in ED Medications  Tdap (BOOSTRIX) injection 0.5 mL (has no administration in time range)  lidocaine-EPINEPHrine (XYLOCAINE W/EPI) 2 %-1:200000 (PF) injection 10 mL (has no administration in time range)  lidocaine (PF) (XYLOCAINE) 1 % injection 5 mL (has no administration in time range)    ED Course/ Medical Decision Making/ A&P                           Medical Decision Making Risk Prescription drug management.   61 year old male with laceration to the left wrist as above.  Wound was irrigated, anesthetized, viewed through full range of motion and does not reveal injury to the tendon.  There is no sensory deficit or weakness.  Wound was closed with 2 simple sutures.  Tetanus was updated.  Recommend wound check in 2 days and suture removal in 10 days.        Final Clinical Impression(s) / ED Diagnoses Final diagnoses:  Laceration of left wrist, initial encounter    Rx / DC Orders ED Discharge Orders     None         Jeannie Fend, PA-C 11/10/21 1454    Terald Sleeper, MD 11/10/21 1525

## 2023-08-12 ENCOUNTER — Encounter (HOSPITAL_COMMUNITY): Payer: Self-pay

## 2023-08-12 ENCOUNTER — Emergency Department (HOSPITAL_COMMUNITY)

## 2023-08-12 ENCOUNTER — Observation Stay (HOSPITAL_COMMUNITY)
Admission: EM | Admit: 2023-08-12 | Discharge: 2023-08-13 | Disposition: A | Discharge status: Home / Self Care | Attending: Internal Medicine | Admitting: Internal Medicine

## 2023-08-12 ENCOUNTER — Observation Stay (HOSPITAL_COMMUNITY)

## 2023-08-12 ENCOUNTER — Other Ambulatory Visit: Payer: Self-pay

## 2023-08-12 DIAGNOSIS — Z96641 Presence of right artificial hip joint: Secondary | ICD-10-CM | POA: Insufficient documentation

## 2023-08-12 DIAGNOSIS — I110 Hypertensive heart disease with heart failure: Secondary | ICD-10-CM | POA: Diagnosis not present

## 2023-08-12 DIAGNOSIS — I5042 Chronic combined systolic (congestive) and diastolic (congestive) heart failure: Secondary | ICD-10-CM | POA: Diagnosis not present

## 2023-08-12 DIAGNOSIS — R739 Hyperglycemia, unspecified: Secondary | ICD-10-CM | POA: Diagnosis not present

## 2023-08-12 DIAGNOSIS — Z7901 Long term (current) use of anticoagulants: Secondary | ICD-10-CM | POA: Insufficient documentation

## 2023-08-12 DIAGNOSIS — I480 Paroxysmal atrial fibrillation: Principal | ICD-10-CM | POA: Insufficient documentation

## 2023-08-12 DIAGNOSIS — D72829 Elevated white blood cell count, unspecified: Secondary | ICD-10-CM | POA: Diagnosis not present

## 2023-08-12 DIAGNOSIS — I4891 Unspecified atrial fibrillation: Secondary | ICD-10-CM

## 2023-08-12 DIAGNOSIS — I2489 Other forms of acute ischemic heart disease: Secondary | ICD-10-CM | POA: Diagnosis not present

## 2023-08-12 DIAGNOSIS — R42 Dizziness and giddiness: Secondary | ICD-10-CM | POA: Diagnosis present

## 2023-08-12 DIAGNOSIS — Z79899 Other long term (current) drug therapy: Secondary | ICD-10-CM | POA: Insufficient documentation

## 2023-08-12 DIAGNOSIS — R0602 Shortness of breath: Secondary | ICD-10-CM | POA: Diagnosis not present

## 2023-08-12 LAB — URINALYSIS, ROUTINE W REFLEX MICROSCOPIC
Bacteria, UA: NONE SEEN
Bilirubin Urine: NEGATIVE
Glucose, UA: NEGATIVE mg/dL
Hgb urine dipstick: NEGATIVE
Ketones, ur: NEGATIVE mg/dL
Nitrite: NEGATIVE
Protein, ur: NEGATIVE mg/dL
Specific Gravity, Urine: 1.01 (ref 1.005–1.030)
pH: 7 (ref 5.0–8.0)

## 2023-08-12 LAB — CBC
HCT: 41.7 % (ref 39.0–52.0)
HCT: 38.3 % — ABNORMAL LOW (ref 39.0–52.0)
Hemoglobin: 14.7 g/dL (ref 13.0–17.0)
Hemoglobin: 12.9 g/dL — ABNORMAL LOW (ref 13.0–17.0)
MCH: 29.1 pg (ref 26.0–34.0)
MCH: 28.7 pg (ref 26.0–34.0)
MCHC: 35.3 g/dL (ref 30.0–36.0)
MCHC: 33.7 g/dL (ref 30.0–36.0)
MCV: 82.6 fL (ref 80.0–100.0)
MCV: 85.3 fL (ref 80.0–100.0)
Platelets: 305 10*3/uL (ref 150–400)
Platelets: 244 10*3/uL (ref 150–400)
RBC: 5.05 MIL/uL (ref 4.22–5.81)
RBC: 4.49 MIL/uL (ref 4.22–5.81)
RDW: 15.8 % — ABNORMAL HIGH (ref 11.5–15.5)
RDW: 16.4 % — ABNORMAL HIGH (ref 11.5–15.5)
WBC: 12 10*3/uL — ABNORMAL HIGH (ref 4.0–10.5)
WBC: 12.5 10*3/uL — ABNORMAL HIGH (ref 4.0–10.5)
nRBC: 0 % (ref 0.0–0.2)
nRBC: 0 % (ref 0.0–0.2)

## 2023-08-12 LAB — ECHOCARDIOGRAM COMPLETE
AR max vel: 2.61 cm2
AV Area VTI: 2.68 cm2
AV Area mean vel: 2.39 cm2
AV Mean grad: 1 mmHg
AV Peak grad: 2.7 mmHg
Ao pk vel: 0.83 m/s
Area-P 1/2: 5.27 cm2
Calc EF: 63.9 %
Height: 66 in
MV VTI: 2.25 cm2
S' Lateral: 2.4 cm
Single Plane A2C EF: 69.3 %
Single Plane A4C EF: 56.5 %
Weight: 2480 [oz_av]

## 2023-08-12 LAB — COMPREHENSIVE METABOLIC PANEL WITH GFR
ALT: 17 U/L (ref 0–44)
AST: 19 U/L (ref 15–41)
Albumin: 3.9 g/dL (ref 3.5–5.0)
Alkaline Phosphatase: 81 U/L (ref 38–126)
Anion gap: 12 (ref 5–15)
BUN: 18 mg/dL (ref 8–23)
CO2: 20 mmol/L — ABNORMAL LOW (ref 22–32)
Calcium: 8.6 mg/dL — ABNORMAL LOW (ref 8.9–10.3)
Chloride: 105 mmol/L (ref 98–111)
Creatinine, Ser: 1.19 mg/dL (ref 0.61–1.24)
GFR, Estimated: >60 mL/min (ref >60)
Glucose, Bld: 115 mg/dL — ABNORMAL HIGH (ref 70–99)
Potassium: 3.6 mmol/L (ref 3.5–5.1)
Sodium: 137 mmol/L (ref 135–145)
Total Bilirubin: 1.2 mg/dL (ref 0.0–1.2)
Total Protein: 7.7 g/dL (ref 6.5–8.1)

## 2023-08-12 LAB — BRAIN NATRIURETIC PEPTIDE: B Natriuretic Peptide: 75.3 pg/mL (ref 0.0–100.0)

## 2023-08-12 LAB — TSH: TSH: 1.581 u[IU]/mL (ref 0.350–4.500)

## 2023-08-12 LAB — TROPONIN I (HIGH SENSITIVITY)
Troponin I (High Sensitivity): 7 ng/L (ref <18)
Troponin I (High Sensitivity): 39 ng/L — ABNORMAL HIGH (ref <18)

## 2023-08-12 LAB — MAGNESIUM: Magnesium: 2 mg/dL (ref 1.7–2.4)

## 2023-08-12 LAB — HEPARIN LEVEL (UNFRACTIONATED): Heparin Unfractionated: 0.34 [IU]/mL (ref 0.30–0.70)

## 2023-08-12 MED ORDER — POTASSIUM CHLORIDE CRYS ER 20 MEQ PO TBCR
40.0000 meq | EXTENDED_RELEASE_TABLET | Freq: Once | ORAL | Status: AC
  Administered 2023-08-12: 40 meq via ORAL
  Filled 2023-08-12: qty 2

## 2023-08-12 MED ORDER — DILTIAZEM HCL-DEXTROSE 125-5 MG/125ML-% IV SOLN (PREMIX)
5.0000 mg/h | INTRAVENOUS | Status: DC
  Administered 2023-08-12: 5 mg/h via INTRAVENOUS
  Filled 2023-08-12: qty 125

## 2023-08-12 MED ORDER — METOPROLOL TARTRATE 5 MG/5ML IV SOLN
5.0000 mg | Freq: Once | INTRAVENOUS | Status: AC
  Administered 2023-08-12: 5 mg via INTRAVENOUS
  Filled 2023-08-12: qty 5

## 2023-08-12 MED ORDER — MAGNESIUM SULFATE 2 GM/50ML IV SOLN
2.0000 g | Freq: Once | INTRAVENOUS | Status: AC
Start: 2023-08-12 — End: 2023-08-12
  Administered 2023-08-12: 2 g via INTRAVENOUS
  Filled 2023-08-12: qty 50

## 2023-08-12 MED ORDER — HYDRALAZINE HCL 25 MG PO TABS
25.0000 mg | ORAL_TABLET | Freq: Once | ORAL | Status: DC
  Filled 2023-08-12: qty 1

## 2023-08-12 MED ORDER — HEPARIN (PORCINE) 25000 UT/250ML-% IV SOLN
1250.0000 [IU]/h | INTRAVENOUS | Status: DC
  Administered 2023-08-12: 1050 [IU]/h via INTRAVENOUS
  Administered 2023-08-13: 1250 [IU]/h via INTRAVENOUS
  Filled 2023-08-12 (×2): qty 250

## 2023-08-12 MED ORDER — LORAZEPAM 1 MG PO TABS: 1.0000 mg | ORAL_TABLET | Freq: Every evening | ORAL | Status: DC | PRN

## 2023-08-12 MED ORDER — ORAL CARE MOUTH RINSE: 15.0000 mL | OROMUCOSAL | Status: DC | PRN

## 2023-08-12 MED ORDER — METOPROLOL TARTRATE 25 MG PO TABS
12.5000 mg | ORAL_TABLET | Freq: Two times a day (BID) | ORAL | Status: DC
  Administered 2023-08-13: 12.5 mg via ORAL
  Filled 2023-08-12 (×2): qty 1

## 2023-08-12 MED ORDER — ISOSORBIDE MONONITRATE ER 30 MG PO TB24
30.0000 mg | ORAL_TABLET | Freq: Every day | ORAL | Status: DC
  Administered 2023-08-12 – 2023-08-13 (×2): 30 mg via ORAL
  Filled 2023-08-12 (×2): qty 1

## 2023-08-12 MED ORDER — PROCHLORPERAZINE EDISYLATE 10 MG/2ML IJ SOLN: 10.0000 mg | Freq: Four times a day (QID) | INTRAMUSCULAR | Status: DC | PRN

## 2023-08-12 MED ORDER — HYDRALAZINE HCL 25 MG PO TABS
25.0000 mg | ORAL_TABLET | ORAL | Status: DC | PRN
  Administered 2023-08-12: 25 mg via ORAL

## 2023-08-12 MED ORDER — METOPROLOL TARTRATE 5 MG/5ML IV SOLN: 5.0000 mg | Freq: Four times a day (QID) | INTRAVENOUS | Status: DC | PRN

## 2023-08-12 MED ORDER — ACETAMINOPHEN 650 MG RE SUPP: 650.0000 mg | Freq: Four times a day (QID) | RECTAL | Status: DC | PRN

## 2023-08-12 MED ORDER — HEPARIN BOLUS VIA INFUSION
3500.0000 [IU] | Freq: Once | INTRAVENOUS | Status: AC
  Administered 2023-08-12: 3500 [IU] via INTRAVENOUS
  Filled 2023-08-12: qty 3500

## 2023-08-12 MED ORDER — LISINOPRIL 5 MG PO TABS
2.5000 mg | ORAL_TABLET | Freq: Every day | ORAL | Status: DC
  Administered 2023-08-12 – 2023-08-13 (×2): 2.5 mg via ORAL
  Filled 2023-08-12 (×2): qty 1

## 2023-08-12 MED ORDER — HYDRALAZINE HCL 25 MG PO TABS: 25.0000 mg | ORAL_TABLET | Freq: Four times a day (QID) | ORAL | Status: DC | PRN

## 2023-08-12 MED ORDER — DILTIAZEM HCL ER 60 MG PO CP12
60.0000 mg | ORAL_CAPSULE | Freq: Two times a day (BID) | ORAL | Status: DC
  Filled 2023-08-12: qty 1

## 2023-08-12 MED ORDER — ACETAMINOPHEN 325 MG PO TABS: 650.0000 mg | ORAL_TABLET | Freq: Four times a day (QID) | ORAL | Status: DC | PRN

## 2023-08-12 MED ORDER — DILTIAZEM HCL 25 MG/5ML IV SOLN
5.0000 mg | Freq: Once | INTRAVENOUS | Status: AC
  Administered 2023-08-12: 5 mg via INTRAVENOUS
  Filled 2023-08-12: qty 5

## 2023-08-12 MED ORDER — DILTIAZEM HCL 25 MG/5ML IV SOLN: 10.0000 mg | INTRAVENOUS | Status: DC | PRN

## 2023-08-12 NOTE — Progress Notes (Signed)
  Echocardiogram 2D Echocardiogram has been performed.  Porcha Deblanc L Pegi Milazzo RDCS 08/12/2023, 3:17 PM

## 2023-08-12 NOTE — Progress Notes (Signed)
 PHARMACY - ANTICOAGULATION CONSULT NOTE  Pharmacy Consult for heparin Indication: atrial fibrillation  No Known Allergies  Patient Measurements: Height: 5\' 6"  (167.6 cm) Weight: 70.3 kg (155 lb) IBW/kg (Calculated) : 63.8 HEPARIN DW (KG): 70.3  Vital Signs: Temp: 98.1 F (36.7 C) (05/16 1234) Temp Source: Oral (05/16 1234) BP: 153/101 (05/16 1400) Pulse Rate: 112 (05/16 1400)  Labs: Recent Labs    08/12/23 1244  HGB 14.7  HCT 41.7  PLT 305  CREATININE 1.19  TROPONINIHS 7    Estimated Creatinine Clearance: 56.6 mL/min (by C-G formula based on SCr of 1.19 mg/dL).   Medical History: Past Medical History:  Diagnosis Date   Arthritis    Heartburn    Hemorrhoids    Hypertension    Assessment: 63 YO male presenting with new onset atrial fibrillation, not on anticoagulation PTA. Pharmacy consulted for heparin dosing.  Today, 08/12/23: Hgb 14.7, plts 305--stable Scr 1.19--stable, baseline unknown  Trops flat   Goal of Therapy:  Heparin level 0.3-0.7 units/ml Monitor platelets by anticoagulation protocol: Yes   Plan:  Give 3500 units bolus x 1 Start heparin infusion at 1050 units/hr Check anti-Xa level in 8 hours and daily while on heparin Continue to monitor H&H and platelets   Roselee Cong, PharmD Clinical Pharmacist  5/16/20252:17 PM

## 2023-08-12 NOTE — Plan of Care (Signed)
 Patient converted out of Afib in ED.  Doing well on unit. Heparin started with Diltiazem.  Plan to convert to PO meds for rate control in the morning

## 2023-08-12 NOTE — H&P (Addendum)
 History and Physical    Patient: Mitchell Johnston:[id number] DOB: Oct 04, 1960 DOA: 08/12/2023 DOS: the patient was seen and examined on 08/12/2023 PCP: Patient, No Pcp Per  Patient coming from: Home  Chief Complaint:  Chief Complaint  Patient presents with   Dizziness   HPI: Mitchell Johnston is a 63 y.o. 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 No prior history of this. To his knowledge, no family history of arrhythmias.  He drinks a cup of regular coffee most days in the morning and sometimes he drinks a glass of iced tea.  He does not drink sodas with caffeine. He denied any stimulant drug use.  Does not remember having any recent palpitations. No chest pain, diaphoresis, PND, orthopnea or pitting edema of the lower extremities. He denied fever, chills, rhinorrhea, sore throat, wheezing or hemoptysis. No abdominal pain, nausea, emesis, diarrhea, constipation, melena or hematochezia.  No flank pain, dysuria, frequency or hematuria.  No polyuria, polydipsia, polyphagia or blurred vision.  The patient thinks that he may have been getting lisinopril  before for hypertension.  Lab work: Urinalysis showed trace leukocyte esterase, but was otherwise unremarkable.  CBC showed white count 12.0, hemoglobin 14.7 g/dL with normal platelets.  Troponin was 7 then 39 ng/L.  Magnesium  is 2.0 mg/dL.  TSH was 1.581.  Imaging: Portable 1 view chest radiograph with normal heart size and mediastinal contours.   ED course: Initial vital signs were temperature 98.1 F, pulse 99, respiration 18, BP 166/97 mmHg O2 sat 99% on room air.  The patient was started on a diltiazem  infusion after 5 mg bolus.  I added heparin  infusion after Dr. Reba Camper discussed with Dr. Theodis Fiscal, magnesium  sulfate 2 g IVPB AM KCl 40 mg p.o. x 1.  Review of  Systems: As mentioned in the history of present illness. All other systems reviewed and are negative.  Past Medical History:  Diagnosis Date   Arthritis    Heartburn    Hemorrhoids    Hypertension    Past Surgical History:  Procedure Laterality Date   HERNIA REPAIR     inguinal   TOTAL HIP ARTHROPLASTY Right 01/09/2013   Procedure: RIGHT TOTAL HIP ARTHROPLASTY ANTERIOR APPROACH;  Surgeon: Bevin Bucks, MD;  Location: [facility];  Service: Orthopedics;  Laterality: Right;   Social History:  reports that he has never smoked. He has never used smokeless tobacco. He reports that he does not drink alcohol and does not use drugs.  No Known Allergies  History reviewed. No pertinent family history.  Prior to Admission medications   Medication Sig Start Date End Date Taking? Authorizing Provider  amLODipine  (NORVASC ) 5 MG tablet Take 1 tablet (5 mg total) by mouth daily. 09/24/17   Carlton Chick, MD  Cholecalciferol (VITAMIN D PO) Take 1 tablet by mouth daily.    [provider]  Multiple Vitamin (MULTIVITAMIN WITH MINERALS) TABS tablet Take 1 tablet by mouth daily.    [provider]    Physical Exam: Vitals:   08/12/23 1234 08/12/23 1300 08/12/23 1340  BP: (!) 166/97 (!) 168/139 (!) 148/105  Pulse: 99 (!) 35 87  Resp: 18 20 17   Temp: 98.1 F (36.7 C)    TempSrc: Oral    SpO2: 99% 100% 100%  Weight: 70.3 kg    Height: 5\' 6"  (1.676 m)  Physical Exam Vitals and nursing note reviewed.  Constitutional:      Appearance: He is ill-appearing.  HENT:     Head: Normocephalic.     Nose: No rhinorrhea.     Mouth/Throat:     Mouth: Mucous membranes are moist.  Eyes:     General: No scleral icterus.    Pupils: Pupils are equal, round, and reactive to light.  Cardiovascular:     Rate and Rhythm: Tachycardia present. Rhythm irregular.  Pulmonary:     Effort: Pulmonary effort is normal.     Breath sounds: Normal breath sounds. No wheezing, rhonchi or rales.   Abdominal:     General: Bowel sounds are normal. There is no distension.     Palpations: Abdomen is soft.     Tenderness: There is no abdominal tenderness. There is no guarding.  Musculoskeletal:     Cervical back: Neck supple.     Right lower leg: No edema.     Left lower leg: No edema.  Skin:    General: Skin is warm and dry.  Neurological:     General: No focal deficit present.     Mental Status: He is alert and oriented to person, place, and time.  Psychiatric:        Mood and Affect: Mood normal.        Behavior: Behavior normal.     Data Reviewed:  There are no new results to review at this time.  EKG: Vent. rate 162 BPM PR interval * ms QRS duration 100 ms QT/QTcB 311/495 ms P-R-T axes * 29 38 Atrial fibrillation with rapid V-rate Left ventricular hypertrophy Anterior Q waves, possibly due to LVH ST depression, probably rate related Baseline wander in lead(s) III  EKG #2: Vent. rate 81 BPM PR interval 175 ms QRS duration 157 ms QT/QTcB 399/464 ms P-R-T axes 46 7 31 Sinus rhythm IVCD, consider atypical RBBB Probable left ventricular hypertrophy Anterior Q waves, possibly due to LVH  Echocardiogram done today: IMPRESSIONS:   1. Left ventricular ejection fraction, by estimation, is 35 to 40%. The  left ventricle has moderately decreased function. The left ventricle  demonstrates global hypokinesis. There is mild concentric left ventricular  hypertrophy. Left ventricular  diastolic parameters are consistent with Grade II diastolic dysfunction  (pseudonormalization).   2. Right ventricular systolic function is mildly reduced. The right  ventricular size is normal. Tricuspid regurgitation signal is inadequate  for assessing PA pressure.   3. Left atrial size was mildly dilated.   4. The mitral valve is normal in structure. Trivial mitral valve  regurgitation. No evidence of mitral stenosis.   5. The aortic valve is tricuspid. Aortic valve regurgitation  is not  visualized. No aortic stenosis is present.   6. The inferior vena cava is normal in size with greater than 50%  respiratory variability, suggesting right atrial pressure of 3 mmHg.   Assessment and Plan: Principal Problem:   Atrial fibrillation with RVR (HCC) CHA?DS?-VASc Score of at least 2. Observation/PCU. Magnesium  sulfate 2 g IVPB. Continue heparin  infusion. DOAC as an outpatient. Diltiazem  infusion has been held. Will start beta-blocker twice daily. Keep electrolytes optimized. Cardiology will evaluate in the morning.  Active Problems:   Chronic combined systolic and diastolic congestive heart failure (HCC) Likely from untreated hypertension. Patient is not in failure. BNP level normal. Will start low-dose metoprolol . Hydralazine  25 mg p.o. x 1. -Then hydralazine  25 mg p.o. as needed. Restart or begin low-dose lisinopril . -May switch to Entresto  after cardiology evaluation. Begin Imdur  30 mg p.o. daily.    Hypocalcemia Recheck calcium level in AM.   Further workup depending on results.    Leukocytosis No fever or signs of active infection. Follow WBC in the morning.    Hyperglycemia Recheck glucose fasting in the morning.     Advance Care Planning:   Code Status: Full Code   Consults: Cardiology will evaluate in AM.  Family Communication:   Severity of Illness: The appropriate patient status for this patient is OBSERVATION. Observation status is judged to be reasonable and necessary in order to provide the required intensity of service to ensure the patient's safety. The patient's presenting symptoms, physical exam findings, and initial radiographic and laboratory data in the context of their medical condition is felt to place them at decreased risk for further clinical deterioration. Furthermore, it is anticipated that the patient will be medically stable for discharge from the hospital within 2 midnights of admission.   Author: Danice Dural,  MD 08/12/2023 2:01 PM  For on call review [email or web address].   This document was prepared using Dragon voice recognition software and may contain some unintended transcription errors.

## 2023-08-12 NOTE — ED Provider Notes (Signed)
 [facility] EMERGENCY DEPARTMENT AT [facility] Provider Note   CSN: [id number] Arrival date & time: 08/12/23  1224     History  Chief Complaint  Patient presents with   Dizziness    Mitchell Johnston is a 63 y.o. male.  63 year old male with prior medical history as detailed below presents for evaluation.  Patient reports that he felt lightheaded and woozy today while at work.  He denies any chest pain or shortness of breath.  He denies palpitations.  He denies fever.  Triage EKG suggest that patient is in A-fib with RVR with a heart rate into the 160s.  He is minimally symptomatic.  He does not have a history of A-fib.  He denies any prior cardiac disease.  He denies any sensation of palpitations.  The history is provided by the patient.       Home Medications Prior to Admission medications   Medication Sig Start Date End Date Taking? Authorizing Provider  amLODipine  (NORVASC ) 5 MG tablet Take 1 tablet (5 mg total) by mouth daily. 09/24/17   Carlton Chick, MD  Cholecalciferol (VITAMIN D PO) Take 1 tablet by mouth daily.    [provider]  Multiple Vitamin (MULTIVITAMIN WITH MINERALS) TABS tablet Take 1 tablet by mouth daily.    [provider]      Allergies    Patient has no known allergies.    Review of Systems   Review of Systems  All other systems reviewed and are negative.   Physical Exam Updated Vital Signs BP (!) 166/97 (BP Location: Right Arm)   Pulse 99   Temp 98.1 F (36.7 C) (Oral)   Resp 18   Ht 5\' 6"  (1.676 m)   Wt 70.3 kg   SpO2 99%   BMI 25.02 kg/m  Physical Exam Vitals and nursing note reviewed.  Constitutional:      General: He is not in acute distress.    Appearance: Normal appearance. He is well-developed.  HENT:     Head: Normocephalic and atraumatic.  Eyes:     Conjunctiva/sclera: Conjunctivae normal.     Pupils: Pupils are equal, round, and reactive to light.  Cardiovascular:     Rate and Rhythm:  Tachycardia present. Rhythm irregular.     Heart sounds: Normal heart sounds.  Pulmonary:     Effort: Pulmonary effort is normal. No respiratory distress.     Breath sounds: Normal breath sounds.  Abdominal:     General: There is no distension.     Palpations: Abdomen is soft.     Tenderness: There is no abdominal tenderness.  Musculoskeletal:        General: No deformity. Normal range of motion.     Cervical back: Normal range of motion and neck supple.  Skin:    General: Skin is warm and dry.  Neurological:     General: No focal deficit present.     Mental Status: He is alert and oriented to person, place, and time.     ED Results / Procedures / Treatments   Labs (all labs ordered are listed, but only abnormal results are displayed) Labs Reviewed  COMPREHENSIVE METABOLIC PANEL WITH GFR  CBC  URINALYSIS, ROUTINE W REFLEX MICROSCOPIC  TSH  MAGNESIUM  CBG MONITORING, ED  TROPONIN I (HIGH SENSITIVITY)    EKG EKG Interpretation Date/Time:  Friday Aug 12 2023 12:39:33 EDT Ventricular Rate:  162 PR Interval:    QRS Duration:  100 QT Interval:  311 QTC  Calculation: 495 R Axis:   29  Text Interpretation: Atrial fibrillation with rapid V-rate Left ventricular hypertrophy Anterior Q waves, possibly due to LVH ST depression, probably rate related Baseline wander in lead(s) III Confirmed by Angela Kell [phone]) on 08/12/2023 12:50:40 PM  Radiology No results found.  Procedures Procedures    Medications Ordered in ED Medications  diltiazem (CARDIZEM) 125 mg in dextrose  5% 125 mL (1 mg/mL) infusion (has no administration in time range)  diltiazem (CARDIZEM) injection 5 mg (has no administration in time range)    ED Course/ Medical Decision Making/ A&P Clinical Course as of 08/12/23 1400  Fri Aug 12, 2023  1338 DG Chest [place] 1 View [PM]    Clinical Course User Index [PM] Burnette Carte, MD                                 Medical Decision Making Amount and/or  Complexity of Data Reviewed Labs: ordered. Radiology: ordered. Decision-making details documented in ED Course.  Risk Prescription drug management. Decision regarding hospitalization.    Medical Screen Complete  This patient presented to the ED with complaint of weakness.  This complaint involves an extensive number of treatment options. The initial differential diagnosis includes, but is not limited to, A-fib, arrhythmia, metabolic abnormality, ACS, etc.  This presentation is: Acute, Chronic, Self-Limited, Previously Undiagnosed, Uncertain Prognosis, Complicated, Systemic Symptoms, and Threat to Life/Bodily Function  Presents for evaluation of general weakness and lightheadedness.  Triage EKG reveals A-fib with RVR.  Heart rate is in the 160s.  Blood pressure is well-preserved.  Patient is minimally symptomatic.  Tachycardia is improved after Cardizem push dose and Cardizem drip.  Cardiology is aware of case will evaluate in consultation.  Agree with plan for heparinization.  Hospitalist service is aware of case will evaluate for admission.  Additional history obtained:  External records from outside sources obtained and reviewed including prior ED visits and prior Inpatient records.    Problem List / ED Course:  Atrial fibrillation with RVR, new onset   Reevaluation:  After the interventions noted above, I reevaluated the patient and found that they have: improved   Disposition:  After consideration of the diagnostic results and the patients response to treatment, I feel that the patent would benefit from admission.   CRITICAL CARE Performed by: Burnette Carte   Total critical care time: 30 minutes  Critical care time was exclusive of separately billable procedures and treating other patients.  Critical care was necessary to treat or prevent imminent or life-threatening deterioration.  Critical care was time spent personally by me on the following  activities: development of treatment plan with patient and/or surrogate as well as nursing, discussions with consultants, evaluation of patient's response to treatment, examination of patient, obtaining history from patient or surrogate, ordering and performing treatments and interventions, ordering and review of laboratory studies, ordering and review of radiographic studies, pulse oximetry and re-evaluation of patient's condition.          Final Clinical Impression(s) / ED Diagnoses Final diagnoses:  Atrial fibrillation with RVR [facility])    Rx / DC Orders ED Discharge Orders          Ordered    Amb referral to AFIB Clinic        08/12/23 1425              Burnette Carte, MD 08/12/23 1450

## 2023-08-12 NOTE — ED Triage Notes (Addendum)
 Pt came in for lightheadedness and dizziness. Pt had pineapple water about 30 minutes ago and that's when the symptoms started. Pt hasn't had anything to eat since last night as well.

## 2023-08-12 NOTE — Progress Notes (Signed)
 PHARMACY - ANTICOAGULATION CONSULT NOTE  Pharmacy Consult for heparin Indication: atrial fibrillation  No Known Allergies  Patient Measurements: Height: 5\' 6"  (167.6 cm) Weight: 73.3 kg (161 lb 9.6 oz) IBW/kg (Calculated) : 63.8 HEPARIN DW (KG): 73.3  Vital Signs: Temp: 97.9 F (36.6 C) (05/16 1928) Temp Source: Oral (05/16 1928) BP: 140/102 (05/16 2112) Pulse Rate: 56 (05/16 2112)  Labs: Recent Labs    08/12/23 1244 08/12/23 1553 08/12/23 2329  HGB 14.7  --  12.9*  HCT 41.7  --  38.3*  PLT 305  --  244  HEPARINUNFRC  --   --  0.34  CREATININE 1.19  --   --   TROPONINIHS 7 39*  --     Estimated Creatinine Clearance: 56.6 mL/min (by C-G formula based on SCr of 1.19 mg/dL).   Medical History: Past Medical History:  Diagnosis Date   Arthritis    Heartburn    Hemorrhoids    Hypertension    Assessment: 63 YO male presenting with new onset atrial fibrillation, not on anticoagulation PTA. Pharmacy consulted for heparin dosing.  08/13/2023 HL 0.34 therapeutic on 1050 units/hr Hgb 12.9, plts 244 No complications of therapy noted  Goal of Therapy:  Heparin level 0.3-0.7 units/ml Monitor platelets by anticoagulation protocol: Yes   Plan:  Continue heparin drip at 1050 units/hr Confirmatory level in 6 hours Daily CBC  [place] RPh 08/13/2023, 12:02 AM

## 2023-08-13 ENCOUNTER — Other Ambulatory Visit (HOSPITAL_COMMUNITY): Payer: Self-pay

## 2023-08-13 DIAGNOSIS — I2489 Other forms of acute ischemic heart disease: Secondary | ICD-10-CM | POA: Insufficient documentation

## 2023-08-13 DIAGNOSIS — I4891 Unspecified atrial fibrillation: Secondary | ICD-10-CM | POA: Diagnosis not present

## 2023-08-13 LAB — COMPREHENSIVE METABOLIC PANEL WITH GFR
ALT: 15 U/L (ref 0–44)
AST: 14 U/L — ABNORMAL LOW (ref 15–41)
Albumin: 3.2 g/dL — ABNORMAL LOW (ref 3.5–5.0)
Alkaline Phosphatase: 64 U/L (ref 38–126)
Anion gap: 7 (ref 5–15)
BUN: 19 mg/dL (ref 8–23)
CO2: 22 mmol/L (ref 22–32)
Calcium: 8.7 mg/dL — ABNORMAL LOW (ref 8.9–10.3)
Chloride: 108 mmol/L (ref 98–111)
Creatinine, Ser: 0.94 mg/dL (ref 0.61–1.24)
GFR, Estimated: >60 mL/min (ref >60)
Glucose, Bld: 91 mg/dL (ref 70–99)
Potassium: 4.1 mmol/L (ref 3.5–5.1)
Sodium: 137 mmol/L (ref 135–145)
Total Bilirubin: 0.8 mg/dL (ref 0.0–1.2)
Total Protein: 6.4 g/dL — ABNORMAL LOW (ref 6.5–8.1)

## 2023-08-13 LAB — HEPARIN LEVEL (UNFRACTIONATED): Heparin Unfractionated: <0.1 [IU]/mL — ABNORMAL LOW (ref 0.30–0.70)

## 2023-08-13 MED ORDER — APIXABAN 5 MG PO TABS
5.0000 mg | ORAL_TABLET | Freq: Two times a day (BID) | ORAL | Status: DC
  Administered 2023-08-13: 5 mg via ORAL
  Filled 2023-08-13: qty 1

## 2023-08-13 MED ORDER — LOSARTAN POTASSIUM 25 MG PO TABS: 25.0000 mg | ORAL_TABLET | Freq: Every day | ORAL | Status: DC

## 2023-08-13 MED ORDER — METOPROLOL TARTRATE 25 MG PO TABS
12.5000 mg | ORAL_TABLET | Freq: Two times a day (BID) | ORAL | 0 refills | Status: DC
  Filled 2023-08-13: qty 30, 30d supply, fill #0

## 2023-08-13 MED ORDER — EMPAGLIFLOZIN 10 MG PO TABS
10.0000 mg | ORAL_TABLET | Freq: Every day | ORAL | Status: DC
  Filled 2023-08-13: qty 1

## 2023-08-13 MED ORDER — EMPAGLIFLOZIN 10 MG PO TABS
10.0000 mg | ORAL_TABLET | Freq: Every day | ORAL | 1 refills | Status: DC
  Filled 2023-08-13: qty 30, 30d supply, fill #0

## 2023-08-13 MED ORDER — HEPARIN BOLUS VIA INFUSION
2000.0000 [IU] | Freq: Once | INTRAVENOUS | Status: AC
  Administered 2023-08-13: 2000 [IU] via INTRAVENOUS
  Filled 2023-08-13: qty 2000

## 2023-08-13 MED ORDER — LOSARTAN POTASSIUM 25 MG PO TABS
25.0000 mg | ORAL_TABLET | Freq: Every day | ORAL | 1 refills | Status: DC
  Filled 2023-08-13: qty 30, 30d supply, fill #0

## 2023-08-13 MED ORDER — APIXABAN 5 MG PO TABS
5.0000 mg | ORAL_TABLET | Freq: Two times a day (BID) | ORAL | 1 refills | Status: DC
  Filled 2023-08-13: qty 60, 30d supply, fill #0

## 2023-08-13 NOTE — Progress Notes (Signed)
   08/13/23 1340  TOC Brief Assessment  Insurance and Status Reviewed  Patient has primary care physician Yes  Home environment has been reviewed home w/ family  Prior level of function: independent  Prior/Current Home Services No current home services  Social Drivers of Health Review SDOH reviewed no interventions necessary  Readmission risk has been reviewed Yes  Transition of care needs no transition of care needs at this time

## 2023-08-13 NOTE — Discharge Summary (Signed)
 Physician Discharge Summary  Mitchell Johnston ZOX:[id number] DOB: 07/01/1960 DOA: 08/12/2023  PCP: Patient, No Pcp Per  Admit date: 08/12/2023 Discharge date: 08/13/2023  Admitted From: Home Disposition: Home  Recommendations for Outpatient Follow-up:  Establish with PCP.  Follow-up with outpatient cardiology and EP  Discharge Condition: Stable CODE STATUS: Full code Diet recommendation: Heart healthy  Brief/Interim Summary: From H&P: " Mitchell Johnston is a 63 y.o. 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 No prior history of this. To his knowledge, no family history of arrhythmias.  He drinks a cup of regular coffee most days in the morning and sometimes he drinks a glass of iced tea.  He does not drink sodas with caffeine. He denied any stimulant drug use.  Does not remember having any recent palpitations. No chest pain, diaphoresis, PND, orthopnea or pitting edema of the lower extremities. He denied fever, chills, rhinorrhea, sore throat, wheezing or hemoptysis. No abdominal pain, nausea, emesis, diarrhea, constipation, melena or hematochezia.  No flank pain, dysuria, frequency or hematuria.  No polyuria, polydipsia, polyphagia or blurred vision.  The patient thinks that he may have been getting lisinopril  before for hypertension. "  Patient was found to be in A-fib RVR.  He was initially started on diltiazem  infusion as well as IV heparin  infusion.  Cardiology was consulted.  Patient converted to normal sinus rhythm and was transition to metoprolol  p.o. as well as Eliquis.  Echocardiogram revealed systolic and diastolic heart failure with EF 35%, grade 2 diastolic dysfunction.  He was not in acute exacerbation.  His medications were optimized and was discharged home in stable condition.  On day of discharge, patient  had no new symptoms and was feeling well.  Discharge Diagnoses:   Principal Problem:   Atrial fibrillation with RVR (HCC) Active Problems:   Hypocalcemia   Leukocytosis   Hyperglycemia   Chronic combined systolic and diastolic congestive heart failure (HCC)   Demand ischemia [facility])   Discharge Instructions  Discharge Instructions     (HEART FAILURE PATIENTS) Call MD:  Anytime you have any of the following symptoms: 1) 3 pound weight gain in 24 hours or 5 pounds in 1 week 2) shortness of breath, with or without a dry hacking cough 3) swelling in the hands, feet or stomach 4) if you have to sleep on extra pillows at night in order to breathe.   Complete by: As directed    Amb referral to AFIB Clinic   Complete by: As directed    Call MD for:  difficulty breathing, headache or visual disturbances   Complete by: As directed    Call MD for:  extreme fatigue   Complete by: As directed    Call MD for:  persistant dizziness or light-headedness   Complete by: As directed    Call MD for:  persistant nausea and vomiting   Complete by: As directed    Call MD for:  severe uncontrolled pain   Complete by: As directed    Call MD for:  temperature >100.4   Complete by: As directed    Diet - low sodium heart healthy   Complete by: As directed    Discharge instructions   Complete by: As directed    You were cared for by a hospitalist during your hospital stay. If you have any questions about your discharge  medications or the care you received while you were in the hospital after you are discharged, you can call the unit and ask to speak with the hospitalist on call if the hospitalist that took care of you is not available. Once you are discharged, your primary care physician will handle any further medical issues. Please note that NO REFILLS for any discharge medications will be authorized once you are discharged, as it is imperative that you return to your primary care physician (or establish a  relationship with a primary care physician if you do not have one) for your aftercare needs so that they can reassess your need for medications and monitor your lab values.   Increase activity slowly   Complete by: As directed       Allergies as of 08/13/2023   No Known Allergies      Medication List     TAKE these medications    apixaban 5 MG Tabs tablet Commonly known as: ELIQUIS Take 1 tablet (5 mg total) by mouth 2 (two) times daily.   empagliflozin 10 MG Tabs tablet Commonly known as: JARDIANCE Take 1 tablet (10 mg total) by mouth daily.   losartan 25 MG tablet Commonly known as: COZAAR Take 1 tablet (25 mg total) by mouth daily.   metoprolol  tartrate 25 MG tablet Commonly known as: LOPRESSOR  Take 0.5 tablets (12.5 mg total) by mouth 2 (two) times daily.        Follow-up Information     [facility] Follow up.   Why: Establish with a primary care physician as soon as possible Contact information: [address] [place] [place]  [address] [phone]        [facility] at [facility] Follow up.   Specialty: Cardiology Why: Referral sent for A Fib Clinic follow up Contact information: [address] [place]  [phone]               No Known Allergies  Consultations: Cardiology    Procedures/Studies: ECHOCARDIOGRAM COMPLETE Result Date: 08/12/2023    ECHOCARDIOGRAM REPORT   Patient Name:   Mitchell Johnston Date of Exam: 08/12/2023 Medical Rec #:  [id number]        Height:       66.0 in Accession #:    [id number]       Weight:       155.0 lb Date of Birth:  July 23, 1960        BSA:          1.794 m Patient Age:    62 years         BP:           153/101 mmHg Patient Gender: M                HR:           64 bpm. Exam Location:  Inpatient Procedure: 2D Echo, Cardiac Doppler and Color Doppler (Both Spectral and Color            Flow  Doppler were utilized during procedure). Indications:    Atrial fibrillation  History:        Patient has no prior history of Echocardiogram examinations.  Sonographer:    Juanita Shaw Referring Phys: [id number] DAVID MANUEL ORTIZ IMPRESSIONS  1. Left ventricular ejection fraction, by estimation, is 35 to 40%. The left ventricle has moderately decreased function. The left ventricle demonstrates global hypokinesis.  There is mild concentric left ventricular hypertrophy. Left ventricular diastolic parameters are consistent with Grade II diastolic dysfunction (pseudonormalization).  2. Right ventricular systolic function is mildly reduced. The right ventricular size is normal. Tricuspid regurgitation signal is inadequate for assessing PA pressure.  3. Left atrial size was mildly dilated.  4. The mitral valve is normal in structure. Trivial mitral valve regurgitation. No evidence of mitral stenosis.  5. The aortic valve is tricuspid. Aortic valve regurgitation is not visualized. No aortic stenosis is present.  6. The inferior vena cava is normal in size with greater than 50% respiratory variability, suggesting right atrial pressure of 3 mmHg. FINDINGS  Left Ventricle: Left ventricular ejection fraction, by estimation, is 35 to 40%. The left ventricle has moderately decreased function. The left ventricle demonstrates global hypokinesis. The left ventricular internal cavity size was normal in size. There is mild concentric left ventricular hypertrophy. Left ventricular diastolic parameters are consistent with Grade II diastolic dysfunction (pseudonormalization). Right Ventricle: The right ventricular size is normal. No increase in right ventricular wall thickness. Right ventricular systolic function is mildly reduced. Tricuspid regurgitation signal is inadequate for assessing PA pressure. Left Atrium: Left atrial size was mildly dilated. Right Atrium: Right atrial size was normal in size. Pericardium: There is no evidence of  pericardial effusion. Mitral Valve: The mitral valve is normal in structure. Trivial mitral valve regurgitation. No evidence of mitral valve stenosis. MV peak gradient, 1.8 mmHg. The mean mitral valve gradient is 1.0 mmHg. Tricuspid Valve: The tricuspid valve is normal in structure. Tricuspid valve regurgitation is not demonstrated. Aortic Valve: The aortic valve is tricuspid. Aortic valve regurgitation is not visualized. No aortic stenosis is present. Aortic valve mean gradient measures 1.0 mmHg. Aortic valve peak gradient measures 2.7 mmHg. Aortic valve area, by VTI measures 2.68 cm. Pulmonic Valve: The pulmonic valve was normal in structure. Pulmonic valve regurgitation is not visualized. Aorta: The aortic root is normal in size and structure. Venous: The inferior vena cava is normal in size with greater than 50% respiratory variability, suggesting right atrial pressure of 3 mmHg. IAS/Shunts: No atrial level shunt detected by color flow Doppler.  LEFT VENTRICLE PLAX 2D LVIDd:         4.10 cm      Diastology LVIDs:         2.40 cm      LV e' medial:    7.94 cm/s LV PW:         0.90 cm      LV E/e' medial:  8.0 LV IVS:        0.80 cm      LV e' lateral:   11.40 cm/s LVOT diam:     2.00 cm      LV E/e' lateral: 5.6 LV SV:         40 LV SV Index:   22 LVOT Area:     3.14 cm  LV Volumes (MOD) LV vol d, MOD A2C: 156.0 ml LV vol d, MOD A4C: 153.0 ml LV vol s, MOD A2C: 47.9 ml LV vol s, MOD A4C: 66.6 ml LV SV MOD A2C:     108.1 ml LV SV MOD A4C:     153.0 ml LV SV MOD BP:      100.6 ml RIGHT VENTRICLE            IVC RV Basal diam:  3.40 cm    IVC diam: 1.10 cm RV Mid diam:    1.90 cm RV S  prime:     9.03 cm/s TAPSE (M-mode): 1.6 cm LEFT ATRIUM             Index        RIGHT ATRIUM           Index LA diam:        3.10 cm 1.73 cm/m   RA Area:     13.00 cm LA Vol (A2C):   75.1 ml 41.85 ml/m  RA Volume:   29.40 ml  16.38 ml/m LA Vol (A4C):   42.6 ml 23.74 ml/m LA Biplane Vol: 60.3 ml 33.60 ml/m  AORTIC VALVE                     PULMONIC VALVE AV Area (Vmax):    2.61 cm     PV Vmax:       0.58 m/s AV Area (Vmean):   2.39 cm     PV Peak grad:  1.3 mmHg AV Area (VTI):     2.68 cm AV Vmax:           82.50 cm/s AV Vmean:          56.200 cm/s AV VTI:            0.149 m AV Peak Grad:      2.7 mmHg AV Mean Grad:      1.0 mmHg LVOT Vmax:         68.50 cm/s LVOT Vmean:        42.800 cm/s LVOT VTI:          0.127 m LVOT/AV VTI ratio: 0.85  AORTA Ao Root diam: 3.60 cm Ao Asc diam:  3.30 cm MITRAL VALVE MV Area (PHT): 5.27 cm    SHUNTS MV Area VTI:   2.25 cm    Systemic VTI:  0.13 m MV Peak grad:  1.8 mmHg    Systemic Diam: 2.00 cm MV Mean grad:  1.0 mmHg MV Vmax:       0.67 m/s MV Vmean:      42.6 cm/s MV Decel Time: 144 msec MV E velocity: 63.80 cm/s MV A velocity: 57.40 cm/s MV E/A ratio:  1.11 Dalton McleanMD Electronically signed by Archer Bear Signature Date/Time: 08/12/2023/4:23:51 PM    Final    DG Chest Port 1 View Result Date: 08/12/2023 CLINICAL DATA:  Shortness of breath. EXAM: PORTABLE CHEST 1 VIEW COMPARISON:  Chest pain on dated 01/01/2013 FINDINGS: The heart size and mediastinal contours are within normal limits. Both lungs are clear. The visualized skeletal structures are unremarkable. IMPRESSION: No active disease. Electronically Signed   By: Angus Bark M.D.   On: 08/12/2023 13:34       Discharge Exam: Vitals:   08/13/23 0252 08/13/23 1039  BP: (!) 130/90 (!) 157/109  Pulse: (!) 52 63  Resp:    Temp: 97.8 F (36.6 C)   SpO2: 98%     General: Pt is alert, awake, not in acute distress Cardiovascular: RRR, S1/S2 +, no edema Respiratory: CTA bilaterally, no wheezing, no rhonchi, no respiratory distress, no conversational dyspnea, on room air  Abdominal: Soft, NT, ND, bowel sounds + Extremities: no edema, no cyanosis Psych: Normal mood and affect, stable judgement and insight     The results of significant diagnostics from this hospitalization (including imaging, microbiology, ancillary  and laboratory) are listed below for reference.     Microbiology: No results found for this or any previous visit (from the past 240 hours).  Labs: BNP (last 3 results) Recent Labs    08/12/23 1959  BNP 75.3   Basic Metabolic Panel: Recent Labs  Lab 08/12/23 1244 08/12/23 2329  NA 137 137  K 3.6 4.1  CL 105 108  CO2 20* 22  GLUCOSE 115* 91  BUN 18 19  CREATININE 1.19 0.94  CALCIUM 8.6* 8.7*  MG 2.0  --    Liver Function Tests: Recent Labs  Lab 08/12/23 1244 08/12/23 2329  AST 19 14*  ALT 17 15  ALKPHOS 81 64  BILITOT 1.2 0.8  PROT 7.7 6.4*  ALBUMIN 3.9 3.2*   No results for input(s): "LIPASE", "AMYLASE" in the last 168 hours. No results for input(s): "AMMONIA" in the last 168 hours. CBC: Recent Labs  Lab 08/12/23 1244 08/12/23 2329  WBC 12.0* 12.5*  HGB 14.7 12.9*  HCT 41.7 38.3*  MCV 82.6 85.3  PLT 305 244   Cardiac Enzymes: No results for input(s): "CKTOTAL", "CKMB", "CKMBINDEX", "TROPONINI" in the last 168 hours. BNP: Invalid input(s): "POCBNP" CBG: No results for input(s): "GLUCAP" in the last 168 hours. D-Dimer No results for input(s): "DDIMER" in the last 72 hours. Hgb A1c No results for input(s): "HGBA1C" in the last 72 hours. Lipid Profile No results for input(s): "CHOL", "HDL", "LDLCALC", "TRIG", "CHOLHDL", "LDLDIRECT" in the last 72 hours. Thyroid function studies Recent Labs    08/12/23 1244  TSH 1.581   Anemia work up No results for input(s): "VITAMINB12", "FOLATE", "FERRITIN", "TIBC", "IRON", "RETICCTPCT" in the last 72 hours. Urinalysis    Component Value Date/Time   COLORURINE YELLOW 08/12/2023 1535   APPEARANCEUR CLEAR 08/12/2023 1535   LABSPEC 1.010 08/12/2023 1535   PHURINE 7.0 08/12/2023 1535   GLUCOSEU NEGATIVE 08/12/2023 1535   HGBUR NEGATIVE 08/12/2023 1535   BILIRUBINUR NEGATIVE 08/12/2023 1535   KETONESUR NEGATIVE 08/12/2023 1535   PROTEINUR NEGATIVE 08/12/2023 1535   UROBILINOGEN 1.0 01/01/2013 0830    NITRITE NEGATIVE 08/12/2023 1535   LEUKOCYTESUR TRACE (A) 08/12/2023 1535   Sepsis Labs Recent Labs  Lab 08/12/23 1244 08/12/23 2329  WBC 12.0* 12.5*   Microbiology No results found for this or any previous visit (from the past 240 hours).   Patient was seen and examined on the day of discharge and was found to be in stable condition. Time coordinating discharge: 45 minutes including assessment and coordination of care, as well as examination of the patient.   SIGNED:  Daren Eck, DO [facility] 08/13/2023, 12:01 PM

## 2023-08-13 NOTE — Consult Note (Signed)
 CARDIOLOGY CONSULT NOTE    Patient ID: Mitchell Johnston MRN: [id number], DOB/AGE: 65/24/1962 65 y.o.  Admit date: 08/12/2023 Date of Consult: 08/13/2023  Primary Physician: Patient, No Pcp Per Primary Cardiologist: not established Electrophysiologist: not established  Patient Profile: Mitchell Johnston is a 63 y.o. male with a history of hypertension, osteoarthritis who is being seen today for the evaluation of new onset atrial fibrillation with RVR at the request of Dr. Bonita Bussing.  HPI:  Mitchell Johnston is a 63 y.o. 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  He did not have any palpitations, shortness of breath, chest discomfort, ankle swelling or fluid retention, increased fatigue.  The onset of the dizziness was about 30 minutes prior to presenting to the ER, where he was noted to be in atrial fibrillation with RVR.  He was started on a diltiazem  and heparin  drips.  He began to feel better shortly thereafter but cannot tell me exactly when he converted into sinus rhythm.  He denies chest pain, palpitations, dyspnea, PND, orthopnea, nausea, vomiting, syncope, edema, or weight gain.  Past Medical History:  Diagnosis Date   Arthritis    Heartburn    Hemorrhoids    Hypertension       Home medications No medications prior to admission.      Physical Exam: Vitals:   08/12/23 1928 08/12/23 2112 08/13/23 0047 08/13/23 0252  BP: (!) 168/90 (!) 140/102 128/83 (!) 130/90  Pulse: (!) 57 (!) 56 (!) 55 (!) 52  Resp:      Temp: 97.9 F (36.6 C)  97.8 F (36.6 C) 97.8 F (36.6 C)  TempSrc: Oral  Oral Oral  SpO2: 98%  98% 98%  Weight:      Height:        Gen: Appears comfortable, well-nourished CV: RRR, no M/R/G, no dependent edema, no JVD Pulm: breathing easily, no rales or wheezes MSK: no edema  PERTINENT STUDIES SUMMARIZED:  Echocardiogram:      TTE Aug 12, 2023.  EF  35 to 40%.  Grade 2 diastolic dysfunction.  Left atrium is mildly dilated.  Right ventricular systolic function is mildly reduced.  Visualized valves are grossly normal.   EKG: Atrial fibrillation with RVR (personally reviewed)  TELEMETRY:    Sinus rhythm (personally reviewed)   ASSESSMENT & PLAN:  Paroxysmal atrial fibrillation --new onset With RVR No symptoms specific for atrial fibrillation. I suspect his symptoms were due to the combination of atrial fibrillation, depressed EF, and physical labor in the heat.  The A-fib may have preceded symptom onset by some time. He will need outpatient follow-up with the EP to discuss rhythm control options. For now, discharge on metoprolol  and apixaban, GDMT for cardiomyopathy.  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  Hypertension Monitor as GDMT titrated    For questions  or updates, please contact [facility] Please consult [email or web address] for contact info under Cardiology/STEMI.  Signed, Mitchell Silver, MD 08/13/2023 9:34 AM

## 2023-08-13 NOTE — Discharge Instructions (Signed)

## 2023-08-13 NOTE — Progress Notes (Signed)
 PHARMACY - ANTICOAGULATION CONSULT NOTE  Pharmacy Consult for heparin  Indication: atrial fibrillation  No Known Allergies  Patient Measurements: Height: 5\' 6"  (167.6 cm) Weight: 73.3 kg (161 lb 9.6 oz) IBW/kg (Calculated) : 63.8 HEPARIN  DW (KG): 73.3  Vital Signs: Temp: 97.8 F (36.6 C) (05/17 0252) Temp Source: Oral (05/17 0252) BP: 130/90 (05/17 0252) Pulse Rate: 52 (05/17 0252)  Labs: Recent Labs    08/12/23 1244 08/12/23 1553 08/12/23 2329 08/13/23 0806  HGB 14.7  --  12.9*  --   HCT 41.7  --  38.3*  --   PLT 305  --  244  --   HEPARINUNFRC  --   --  0.34 <0.10*  CREATININE 1.19  --  0.94  --   TROPONINIHS 7 39*  --   --     Estimated Creatinine Clearance: 71.6 mL/min (by C-G formula based on SCr of 0.94 mg/dL).   Medical History: Past Medical History:  Diagnosis Date   Arthritis    Heartburn    Hemorrhoids    Hypertension    Assessment: 63 YO male presenting with new onset atrial fibrillation, not on anticoagulation PTA. Pharmacy consulted for heparin  dosing.  08/13/2023 First HL 0.34 therapeutic on 1050 units/hr Confirmatory HL undetectable on 1050 units/hr - per RN no issues with infusion Hg 14.7> 12.9, PLT WNL No complications of therapy noted  Goal of Therapy:  Heparin  level 0.3-0.7 units/ml Monitor platelets by anticoagulation protocol: Yes   Plan:  Heparin  2000 unit bolus IV x 1 increase heparin  drip to 1250 units/hr Heparin  level 6 hours after bolus & rate increase Daily CBC  Rubie Corona, Pharm.D Use secure chat for questions 08/13/2023 10:57 AM

## 2023-08-15 ENCOUNTER — Telehealth (HOSPITAL_BASED_OUTPATIENT_CLINIC_OR_DEPARTMENT_OTHER): Payer: Self-pay | Admitting: *Deleted

## 2023-08-15 NOTE — Telephone Encounter (Signed)
 Copied from CRM [id number]. Topic: Appointments - Scheduling Inquiry for Clinic >> Aug 15, 2023 11:41 AM Sasha H wrote: Reason for CRM: New pt recently released from hospital on blood thinners, pt is needing to be seen so that he does not run out of the medication.

## 2023-08-15 NOTE — Telephone Encounter (Signed)
 Unfortunately this is first available right now with our practice

## 2023-09-05 ENCOUNTER — Other Ambulatory Visit (HOSPITAL_COMMUNITY): Payer: Self-pay

## 2023-09-07 ENCOUNTER — Other Ambulatory Visit (HOSPITAL_COMMUNITY): Payer: Self-pay

## 2023-09-07 ENCOUNTER — Other Ambulatory Visit: Payer: Self-pay | Admitting: Cardiology

## 2023-09-07 ENCOUNTER — Telehealth: Payer: Self-pay | Admitting: Cardiology

## 2023-09-07 MED ORDER — EMPAGLIFLOZIN 10 MG PO TABS
10.0000 mg | ORAL_TABLET | Freq: Every day | ORAL | 1 refills | Status: DC
  Filled 2023-09-07: qty 90, 90d supply, fill #0
  Filled 2023-12-06: qty 30, 30d supply, fill #1
  Filled 2024-01-06: qty 30, 30d supply, fill #2
  Filled 2024-02-10: qty 30, 30d supply, fill #3

## 2023-09-07 MED ORDER — METOPROLOL TARTRATE 25 MG PO TABS
12.5000 mg | ORAL_TABLET | Freq: Two times a day (BID) | ORAL | 1 refills | Status: DC
  Filled 2023-09-07: qty 90, 90d supply, fill #0

## 2023-09-07 MED ORDER — LOSARTAN POTASSIUM 25 MG PO TABS
25.0000 mg | ORAL_TABLET | Freq: Every day | ORAL | 1 refills | Status: DC
  Filled 2023-09-07: qty 90, 90d supply, fill #0
  Filled 2023-12-06: qty 90, 90d supply, fill #1

## 2023-09-07 MED ORDER — APIXABAN 5 MG PO TABS
5.0000 mg | ORAL_TABLET | Freq: Two times a day (BID) | ORAL | 1 refills | Status: DC
  Filled 2023-09-07: qty 180, 90d supply, fill #0
  Filled 2023-12-06 – 2023-12-22 (×2): qty 180, 90d supply, fill #1

## 2023-09-07 NOTE — Telephone Encounter (Signed)
*  STAT* If patient is at the pharmacy, call can be transferred to refill team.   1. Which medications need to be refilled? (please list name of each medication and dose if known) metoprolol  tartrate (LOPRESSOR ) 25 MG tablet   losartan  (COZAAR ) 25 MG tablet    empagliflozin  (JARDIANCE ) 10 MG TABS tablet   apixaban  (ELIQUIS ) 5 MG TABS tablet  2. Would you like to learn more about the convenience, safety, & potential cost savings by using the [facility]? No      3. Are you open to using the [facility] (Type [facility]. ). No    4. Which pharmacy/location (including street and city if local pharmacy) is medication to be sent to?  [place] [facility]     5. Do they need a 30 day or 90 day supply? 90 days   Pt only have 1 pill left

## 2023-09-07 NOTE — Telephone Encounter (Signed)
*  STAT* If patient is at the pharmacy, call can be transferred to refill team.   1. Which medications need to be refilled? (please list name of each medication and dose if known) metoprolol tartrate (LOPRESSOR) 25 MG tablet   2. Which pharmacy/location (including street and city if local pharmacy) is medication to be sent to?[place] [facility]   3. Do they need a 30 day or 90 day supply? 90 day

## 2023-09-07 NOTE — Telephone Encounter (Signed)
 Pt has never been seen in our office. Pt has an upcoming appt in September 2025 with Dr. Alda Amas. Would Dr. Alda Amas like to refill medication until appt time? Please address

## 2023-09-07 NOTE — Telephone Encounter (Signed)
 Pt's medications were sent to pt's pharmacy as requested. Confirmation received.

## 2023-09-09 ENCOUNTER — Other Ambulatory Visit (HOSPITAL_COMMUNITY): Payer: Self-pay

## 2023-09-13 ENCOUNTER — Ambulatory Visit: Attending: Cardiovascular Disease | Admitting: Cardiovascular Disease

## 2023-09-13 ENCOUNTER — Other Ambulatory Visit (HOSPITAL_COMMUNITY): Payer: Self-pay

## 2023-09-13 ENCOUNTER — Encounter: Payer: Self-pay | Admitting: Cardiovascular Disease

## 2023-09-13 VITALS — BP 159/92 | HR 59 | Ht 66.0 in | Wt 165.0 lb

## 2023-09-13 DIAGNOSIS — I4891 Unspecified atrial fibrillation: Secondary | ICD-10-CM

## 2023-09-13 DIAGNOSIS — I5042 Chronic combined systolic (congestive) and diastolic (congestive) heart failure: Secondary | ICD-10-CM

## 2023-09-13 MED ORDER — METOPROLOL SUCCINATE ER 25 MG PO TB24
25.0000 mg | ORAL_TABLET | Freq: Every day | ORAL | 3 refills | Status: AC
  Filled 2023-09-13: qty 90, 90d supply, fill #0
  Filled 2023-12-06: qty 90, 90d supply, fill #1
  Filled 2024-03-09: qty 90, 90d supply, fill #2

## 2023-09-13 NOTE — Patient Instructions (Addendum)
 Medication Instructions:  STOP Metoprolol  Tartrate  START Metoprolol  Succinate ER 25 mg once daily  *If you need a refill on your cardiac medications before your next appointment, please call your pharmacy*  Testing/Procedures: Implantable Loop Recorder - our scheduler will contact you to set this up Your physician has recommended that you have a Cardioversion (DCCV). Electrical Cardioversion uses a jolt of electricity to your heart either through paddles or wired patches attached to your chest. This is a controlled, usually prescheduled, procedure. Defibrillation is done under light anesthesia in the hospital, and you usually go home the day of the procedure. This is done to get your heart back into a normal rhythm. You are not awake for the procedure. Please see the instruction sheet given to you today.  Follow-Up: At [facility], you and your health needs are our priority.  As part of our continuing mission to provide you with exceptional heart care, our providers are all part of one team.  This team includes your primary Cardiologist (physician) and Advanced Practice Providers or APPs (Physician Assistants and Nurse Practitioners) who all work together to provide you with the care you need, when you need it.  Your next appointment:   Please move up your appointment with Dr Alda Amas to be seen sooner   Provider:   Dr Alda Amas

## 2023-09-13 NOTE — Progress Notes (Signed)
 Electrophysiology Office Note:    Date:  09/13/2023   ID:  Mitchell Johnston, DOB Apr 21, 1960, MRN [id number]  PCP:  Patient, No Pcp Per   [facility] Providers Cardiologist:  None     Referring MD: No ref. provider found   History of Present Illness:    Mitchell Johnston is a 63 y.o. male with a medical history significant for atrial fibrillation with RVR, hypertension, CHFrEF , referred for arrhythmia management.      Discussed the use of AI scribe software for clinical note transcription with the patient, who gave verbal consent to proceed.  History of Present Illness Mitchell Johnston is a 63 year old male with atrial fibrillation and heart failure who presents for evaluation of his heart rhythm.  In May 2025, he experienced dizziness while working outside in the heat, leading to an emergency room visit. No palpitations, shortness of breath, chest discomfort, ankle swelling, or fluid retention were noted at that time. In the ER, he was diagnosed with atrial fibrillation with rapid ventricular response and was treated with diltiazem  and heparin  drips. He was discharged on metoprolol  and Eliquis .  During the same hospital visit, he was newly diagnosed with heart failure with reduced ejection fraction. A transthoracic echocardiogram on Aug 12, 2023, showed a left ventricular ejection fraction of 35-40%, grade two diastolic dysfunction, mild concentric left ventricular hypertrophy, and a mildly dilated left atrium.  Since starting medication, he has had no further episodes of increased heart rate or dizziness. He has been on vacation and has not noticed any symptoms. He is currently taking metoprolol  and Eliquis .  He has never paid attention to his heart rate before and did not notice any symptoms of atrial fibrillation prior to the recent episode. No palpitations, shortness of breath, chest discomfort, ankle swelling, or fluid retention during the episode of dizziness.          Today, he reports that he is doing well. He has no complaints -- no palpitations, shortness of breath, chest pain.  EKGs/Labs/Other Studies Reviewed Today:     Echocardiogram:  TTE Aug 12, 2023 LVEF 35 to 40%.  Grade 2 diastolic dysfunction.  Mild concentric LVH.  Mildly dilated left atrium     EKG:   EKG Interpretation Date/Time:  Tuesday September 13 2023 14:48:29 EDT Ventricular Rate:  59 PR Interval:  174 QRS Duration:  94 QT Interval:  438 QTC Calculation: 433 R Axis:   16  Text Interpretation: Sinus bradycardia Incomplete right bundle branch block Left ventricular hypertrophy with repolarization abnormality ( Sokolow-Lyon , Romhilt-Estes ) Cannot rule out Septal infarct , age undetermined When compared with ECG of 12-Aug-2023 14:28, No significant change was found Confirmed by Marlane Silver [phone]) on 09/13/2023 3:03:51 PM     Physical Exam:    VS:  BP (!) 159/92 (BP Location: Left Arm, Patient Position: Sitting, Cuff Size: Normal)   Pulse (!) 59   Ht 5' 6 (1.676 m)   Wt 165 lb (74.8 kg)   SpO2 96%   BMI 26.63 kg/m     Wt Readings from Last 3 Encounters:  09/13/23 165 lb (74.8 kg)  08/12/23 161 lb 9.6 oz (73.3 kg)  11/10/21 162 lb (73.5 kg)     GEN:  Well nourished, well developed in no acute distress CARDIAC: RRR, no murmurs, rubs, gallops RESPIRATORY:  Normal work of breathing MUSCULOSKELETAL: no edema    ASSESSMENT & PLAN:     Paroxysmal atrial fibrillation Diagnosed May 2025  He did not have palpitations, but presented with acute CHF  Congestive heart failure with reduced ejection fraction Appears euvolemic and well compensated today Diagnosed May 2025 while admitted for A-fib with RVR Continue Jardiance  10 mg, losartan  25 mg, metoprolol  -- Will switch from metoprolol  tartrate twice daily to metoprolol  succinate 25 daily Will arrange follow-up with general cardiology for medical management  Secondary hypercoagulable state Continue apixaban  5  mg twice daily   Signed, Efraim Grange, MD  09/13/2023 3:10 PM    [facility]

## 2023-09-26 ENCOUNTER — Encounter (HOSPITAL_BASED_OUTPATIENT_CLINIC_OR_DEPARTMENT_OTHER): Payer: Self-pay | Admitting: Family Medicine

## 2023-09-26 ENCOUNTER — Ambulatory Visit (HOSPITAL_BASED_OUTPATIENT_CLINIC_OR_DEPARTMENT_OTHER): Admitting: Family Medicine

## 2023-09-26 VITALS — BP 182/113 | HR 59 | Ht 66.0 in | Wt 158.0 lb

## 2023-09-26 DIAGNOSIS — I10 Essential (primary) hypertension: Secondary | ICD-10-CM | POA: Insufficient documentation

## 2023-09-26 NOTE — Progress Notes (Unsigned)
 New Patient Office Visit  Subjective   Patient ID: Mitchell Johnston, male    DOB: 31-Jan-1961  Age: 63 y.o. MRN: [id number]  CC:  Chief Complaint  Patient presents with  . New Patient (Initial Visit)    New Patient has not had pcp in awhile was in hospital a month ago for dizziness saw afib dr this was 5/16    HPI KALEEL SCHMIEDER presents to establish care Last PCP - None recently  Recent hospitalization for atrial fibrillation with RVR. History of HTN. Diagnosed with CHF.  Not checking BP at home. Did not take a couple of his medications this morning, not certain which he took this morning.   Patient is originally from [place] , [place]. He works in Administrator, arts at [employer]. He enjoys going to strip club, playing horseshoes, shooting pool.  Outpatient Encounter Medications as of 09/26/2023  Medication Sig  . apixaban  (ELIQUIS ) 5 MG TABS tablet Take 1 tablet (5 mg total) by mouth 2 (two) times daily.  . empagliflozin  (JARDIANCE ) 10 MG TABS tablet Take 1 tablet (10 mg total) by mouth daily.  . losartan  (COZAAR ) 25 MG tablet Take 1 tablet (25 mg total) by mouth daily.  . metoprolol  succinate (TOPROL  XL) 25 MG 24 hr tablet Take 1 tablet (25 mg total) by mouth daily.   No facility-administered encounter medications on file as of 09/26/2023.    Past Medical History:  Diagnosis Date  . Arthritis   . Heartburn   . Hemorrhoids   . Hypertension     Past Surgical History:  Procedure Laterality Date  . HERNIA REPAIR     inguinal  . TOTAL HIP ARTHROPLASTY Right 01/09/2013   Procedure: RIGHT TOTAL HIP ARTHROPLASTY ANTERIOR APPROACH;  Surgeon: Donnice JONETTA Car, MD;  Location: [facility];  Service: Orthopedics;  Laterality: Right;    History reviewed. No pertinent family history.  Social History   Socioeconomic History  . Marital status: Single    Spouse name: Not on file  . Number of children: Not on file  . Years of education: Not on file  . Highest education level: Not  on file  Occupational History  . Not on file  Tobacco Use  . Smoking status: Never    Passive exposure: Never  . Smokeless tobacco: Never  Substance and Sexual Activity  . Alcohol use: No  . Drug use: No  . Sexual activity: Not on file  Other Topics Concern  . Not on file  Social History Narrative  . Not on file   Social Drivers of Health   Financial Resource Strain: Not on file  Food Insecurity: No Food Insecurity (08/12/2023)   Hunger Vital Sign   . Worried About Programme researcher, broadcasting/film/video in the Last Year: Never true   . Ran Out of Food in the Last Year: Never true  Transportation Needs: No Transportation Needs (08/12/2023)   PRAPARE - Transportation   . Lack of Transportation (Medical): No   . Lack of Transportation (Non-Medical): No  Physical Activity: Not on file  Stress: Not on file  Social Connections: Unknown (08/12/2023)   Social Connection and Isolation Panel   . Frequency of Communication with Friends and Family: More than three times a week   . Frequency of Social Gatherings with Friends and Family: More than three times a week   . Attends Religious Services: Patient unable to answer   . Active Member of Clubs or Organizations: Patient declined   . Attends Club  or Organization Meetings: Patient declined   . Marital Status: Married  Catering manager Violence: Not At Risk (08/12/2023)   Humiliation, Afraid, Rape, and Kick questionnaire   . Fear of Current or Ex-Partner: No   . Emotionally Abused: No   . Physically Abused: No   . Sexually Abused: No    Objective   BP (!) 181/106 (BP Location: Right Arm, Patient Position: Sitting, Cuff Size: Normal)   Pulse (!) 59   Ht 5' 6 (1.676 m)   Wt 158 lb (71.7 kg)   SpO2 97%   BMI 25.50 kg/m   Physical Exam  62  Assessment & Plan:   There are no diagnoses linked to this encounter.No follow-ups on file.   Spent 52 minutes on this patient encounter, including preparation, chart review, face-to-face counseling with  patient and coordination of care, and documentation of encounter   ___________________________________________ Ayanah Snader de [place], MD, ABFM, [facility] Primary [facility] and Sports Medicine [facility]

## 2023-09-26 NOTE — Patient Instructions (Signed)
  Medication Instructions:  Your physician recommends that you continue on your current medications as directed. Please refer to the Current Medication list given to you today. --If you need a refill on any your medications before your next appointment, please call your pharmacy first. If no refills are authorized on file call the office.--   Follow-Up: Your next appointment:   Your physician recommends that you schedule a follow-up appointment in: 4-6 week follow up / 2 weeks nurse visit for bp check  with Dr. de Peru  You will receive a text message or e-mail with a link to a survey about your care and experience with us  today! We would greatly appreciate your feedback!   Thanks for letting us  be apart of your health journey!!  Primary Care and Sports Medicine   Dr. Quintin sheerer Peru   We encourage you to activate your patient portal called MyChart.  Sign up information is provided on this After Visit Summary.  MyChart is used to connect with patients for Virtual Visits (Telemedicine).  Patients are able to view lab/test results, encounter notes, upcoming appointments, etc.  Non-urgent messages can be sent to your provider as well. To learn more about what you can do with MyChart, please visit --  [email or web address].

## 2023-09-28 NOTE — Assessment & Plan Note (Signed)
 Blood pressure is elevated in office today.  Unfortunately, patient did not take all of his medications this morning and he is unaware of which ones he did not take.  Thus, it is possible that he did not take his morning blood pressure pills.  At recent visit with cardiologist, blood pressure was better controlled, however was still above goal at that time. Can continue with current medication regimen, discussed importance of taking medication regularly even on days when having office visit.  Recommend intermittent monitoring of blood pressure at home, DASH diet.  Recommend continuing with establishing with general cardiologist as recommended by EP specialist

## 2023-11-02 ENCOUNTER — Ambulatory Visit: Admitting: Cardiovascular Disease

## 2023-12-01 NOTE — Progress Notes (Deleted)
 Cardiology Office Note:    Date:  12/01/2023   ID:  Mitchell Johnston, DOB 1961-01-14, MRN [id number]  PCP:  de [place], Raymond J, MD  Cardiologist:  None  Electrophysiologist:  None   Referring MD: Nancey Eulas BRAVO, MD   No chief complaint on file. ***  History of Present Illness:    Mitchell Johnston is a 63 y.o. male with a hx of chronic combined heart failure, atrial fibrillation who presents for an initial visit.  Presented to ED in 07/2023 with dizziness, found to be in A-fib with RVR.  Echocardiogram 08/12/2023 showed EF 35 to 40%, G2 DD, mild RV dysfunction, no significant valvular disease.  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     Allergies:   Patient has no known allergies.   Social History   Socioeconomic History   Marital status: Single    Spouse name: Not on file   Number of children: Not on file   Years of education: Not on file   Highest education level: Not on file  Occupational History   Not on file  Tobacco Use   Smoking status: Never    Passive exposure: Never   Smokeless tobacco: Never  Substance and Sexual Activity   Alcohol use: No   Drug use: No   Sexual activity: Not on file  Other Topics Concern   Not on file  Social History Narrative   Not on file   Social Drivers of Health   Financial Resource Strain: Not on file  Food Insecurity: No Food Insecurity (08/12/2023)   Hunger Vital Sign    Worried About Running Out of Food in the Last Year: Never true     Ran Out of Food in the Last Year: Never true  Transportation Needs: No Transportation Needs (08/12/2023)   PRAPARE - Administrator, Civil Service (Medical): No    Lack of Transportation (Non-Medical): No  Physical Activity: Not on file  Stress: Not on file  Social Connections: Unknown (08/12/2023)   Social Connection and Isolation Panel    Frequency of Communication with Friends and Family: More than three times a week    Frequency of Social Gatherings with Friends and Family: More than three times a week    Attends Religious Services: Patient unable to answer    Active Member of Clubs or Organizations: Patient declined    Attends Banker Meetings: Patient declined    Marital Status: Married     Family History: The patient's ***family history is not on file.  ROS:   Please see the history of present illness.    *** All other systems reviewed and are negative.  EKGs/Labs/Other Studies Reviewed:    The following studies were reviewed today: ***  EKG:  EKG is *** ordered today.  The ekg ordered today demonstrates ***  Recent Labs: 08/12/2023: ALT 15; B Natriuretic Peptide 75.3; BUN 19; Creatinine, Ser 0.94; Hemoglobin 12.9; Magnesium  2.0; Platelets 244; Potassium  4.1; Sodium 137; TSH 1.581  Recent Lipid Panel No results found for: CHOL, TRIG, HDL, CHOLHDL, VLDL, LDLCALC, LDLDIRECT  Physical Exam:    VS:  There were no vitals taken for this visit.    Wt Readings from Last 3 Encounters:  09/26/23 158 lb (71.7 kg)  09/13/23 165 lb (74.8 kg)  08/12/23 161 lb 9.6 oz (73.3 kg)     GEN: *** Well nourished, well developed in no acute distress HEENT: Normal NECK: No JVD; No carotid bruits LYMPHATICS: No lymphadenopathy CARDIAC: ***RRR, no murmurs, rubs, gallops RESPIRATORY:  Clear to auscultation without rales, wheezing or rhonchi  ABDOMEN: Soft, non-tender, non-distended MUSCULOSKELETAL:  No edema; No deformity  SKIN: Warm and  dry NEUROLOGIC:  Alert and oriented x 3 PSYCHIATRIC:  Normal affect   ASSESSMENT:    No diagnosis found. PLAN:    Chronic combined heart failure: Echocardiogram 08/12/2023 showed EF 35 to 40%, G2 DD, mild RV dysfunction, no significant valvular disease. - Continue Toprol -XL 25 mg daily, losartan  25 mg daily, Jardiance  10 mg daily - Repeat echo***  Atrial fibrillation: Presented with A-fib with RVR 07/2023.  CHA2DS2-VASc 2 (age, CHF) - Continue Eliquis  5 mg twice daily - Continue Celexa 25 mg daily  RTC in***   Medication Adjustments/Labs and Tests Ordered: Current medicines are reviewed at length with the patient today.  Concerns regarding medicines are outlined above.  No orders of the defined types were placed in this encounter.  No orders of the defined types were placed in this encounter.   There are no Patient Instructions on file for this visit.   Signed, Lonni LITTIE Nanas, MD  12/01/2023 10:14 PM    [facility]

## 2023-12-02 ENCOUNTER — Ambulatory Visit: Attending: Cardiology | Admitting: Cardiology

## 2023-12-06 ENCOUNTER — Other Ambulatory Visit (HOSPITAL_COMMUNITY): Payer: Self-pay

## 2023-12-22 ENCOUNTER — Other Ambulatory Visit (HOSPITAL_COMMUNITY): Payer: Self-pay

## 2024-01-06 ENCOUNTER — Other Ambulatory Visit (HOSPITAL_COMMUNITY): Payer: Self-pay

## 2024-02-10 ENCOUNTER — Other Ambulatory Visit (HOSPITAL_COMMUNITY): Payer: Self-pay

## 2024-02-15 ENCOUNTER — Other Ambulatory Visit (HOSPITAL_COMMUNITY): Payer: Self-pay

## 2024-03-09 ENCOUNTER — Other Ambulatory Visit: Payer: Self-pay

## 2024-03-09 ENCOUNTER — Other Ambulatory Visit: Payer: Self-pay | Admitting: Cardiology

## 2024-03-12 ENCOUNTER — Other Ambulatory Visit (HOSPITAL_COMMUNITY): Payer: Self-pay

## 2024-03-12 MED ORDER — LOSARTAN POTASSIUM 25 MG PO TABS
25.0000 mg | ORAL_TABLET | Freq: Every day | ORAL | 1 refills | Status: AC
  Filled 2024-03-12: qty 90, 90d supply, fill #0

## 2024-03-28 ENCOUNTER — Other Ambulatory Visit (HOSPITAL_COMMUNITY): Payer: Self-pay

## 2024-03-28 ENCOUNTER — Other Ambulatory Visit: Payer: Self-pay | Admitting: Cardiology

## 2024-03-28 MED ORDER — EMPAGLIFLOZIN 10 MG PO TABS
10.0000 mg | ORAL_TABLET | Freq: Every day | ORAL | 0 refills | Status: DC
  Filled 2024-03-28: qty 30, 30d supply, fill #0

## 2024-04-12 ENCOUNTER — Other Ambulatory Visit (HOSPITAL_COMMUNITY): Payer: Self-pay

## 2024-04-12 ENCOUNTER — Other Ambulatory Visit: Payer: Self-pay | Admitting: Cardiology

## 2024-04-12 MED ORDER — APIXABAN 5 MG PO TABS
5.0000 mg | ORAL_TABLET | Freq: Two times a day (BID) | ORAL | 1 refills | Status: AC
  Filled 2024-04-12: qty 180, 90d supply, fill #0

## 2024-05-03 ENCOUNTER — Other Ambulatory Visit: Payer: Self-pay | Admitting: Cardiology

## 2024-05-04 ENCOUNTER — Other Ambulatory Visit: Payer: Self-pay

## 2024-05-04 ENCOUNTER — Other Ambulatory Visit (HOSPITAL_COMMUNITY): Payer: Self-pay

## 2024-05-04 MED ORDER — EMPAGLIFLOZIN 10 MG PO TABS
10.0000 mg | ORAL_TABLET | Freq: Every day | ORAL | 5 refills | Status: AC
  Filled 2024-05-04: qty 30, 30d supply, fill #0
